# Patient Record
Sex: Male | Born: 1970
Health system: Southern US, Community
[De-identification: ages and names within clinical notes are randomized; demographics above are authoritative.]

## PROBLEM LIST (undated history)

## (undated) DIAGNOSIS — E785 Hyperlipidemia, unspecified: Secondary | ICD-10-CM

## (undated) DIAGNOSIS — I10 Essential (primary) hypertension: Secondary | ICD-10-CM

## (undated) DIAGNOSIS — Q245 Malformation of coronary vessels: Secondary | ICD-10-CM

## (undated) DIAGNOSIS — R9439 Abnormal result of other cardiovascular function study: Secondary | ICD-10-CM

## (undated) DIAGNOSIS — E669 Obesity, unspecified: Secondary | ICD-10-CM

## (undated) HISTORY — DX: Abnormal result of other cardiovascular function study: R94.39

## (undated) HISTORY — DX: Hyperlipidemia, unspecified: E78.5

## (undated) HISTORY — DX: Essential (primary) hypertension: I10

## (undated) HISTORY — DX: Obesity, unspecified: E66.9

## (undated) HISTORY — DX: Malformation of coronary vessels: Q24.5

---

## 2015-06-15 ENCOUNTER — Other Ambulatory Visit (HOSPITAL_COMMUNITY): Payer: Self-pay | Admitting: Physician Assistant

## 2015-06-15 ENCOUNTER — Ambulatory Visit (HOSPITAL_COMMUNITY)
Admission: RE | Admit: 2015-06-15 | Discharge: 2015-06-15 | Disposition: A | Discharge status: Home / Self Care | Payer: BLUE CROSS/BLUE SHIELD | Source: Ambulatory Visit | Attending: Physician Assistant | Admitting: Physician Assistant

## 2015-06-15 DIAGNOSIS — R079 Chest pain, unspecified: Secondary | ICD-10-CM | POA: Diagnosis not present

## 2015-06-15 DIAGNOSIS — R0789 Other chest pain: Secondary | ICD-10-CM

## 2015-07-21 ENCOUNTER — Encounter: Payer: Self-pay | Admitting: *Deleted

## 2015-08-03 ENCOUNTER — Encounter: Payer: Self-pay | Admitting: *Deleted

## 2015-08-04 ENCOUNTER — Encounter: Payer: Self-pay | Admitting: Cardiology

## 2015-08-04 ENCOUNTER — Encounter: Payer: Self-pay | Admitting: *Deleted

## 2015-08-04 ENCOUNTER — Ambulatory Visit (INDEPENDENT_AMBULATORY_CARE_PROVIDER_SITE_OTHER): Payer: BLUE CROSS/BLUE SHIELD | Admitting: Cardiology

## 2015-08-04 VITALS — BP 148/98 | HR 77 | Ht 66.0 in | Wt 214.0 lb

## 2015-08-04 DIAGNOSIS — I10 Essential (primary) hypertension: Secondary | ICD-10-CM | POA: Diagnosis not present

## 2015-08-04 DIAGNOSIS — R072 Precordial pain: Secondary | ICD-10-CM | POA: Diagnosis not present

## 2015-08-04 DIAGNOSIS — E785 Hyperlipidemia, unspecified: Secondary | ICD-10-CM

## 2015-08-04 DIAGNOSIS — R9431 Abnormal electrocardiogram [ECG] [EKG]: Secondary | ICD-10-CM

## 2015-08-04 NOTE — Patient Instructions (Signed)
Your physician has requested that you have a stress echocardiogram. For further information please visit [email or web address]. Please follow instruction sheet as given. Office will contact with results via phone or letter.   Continue all current medications. Follow up based on test results.

## 2015-08-04 NOTE — Progress Notes (Signed)
Cardiology Office Note  Date: 08/04/2015   ID: Duane Crosby, DOB [birth year], MRN [id number]  PCP: Lenise HeraldMANN, BENJAMIN, PA-C  Primary Cardiologist: Nona DellSamuel Reneka Nebergall, MD   Chief Complaint  Patient presents with  . Dizziness  . History of chest pain    History of Present Illness: Duane Crosby is a 44 y.o. male referred by Mr. Marcelline MatesMann PA-C for cardiology evaluation. Records indicate evaluation back in October for dizziness and chest pain. He is here with his wife today. He states that over the last 8 months he has been experiencing a "sharp" discomfort in his left chest, occurs somewhat sporadically, sometimes when he is at work. Symptoms last generally for a few minutes and are of moderate intensity. There are no specific alleviating factors. He works as a Chartered certified accountantmachinist, no particularly heavy work at this time. He has not noticed any specific change in pattern, symptoms occur at least once every few weeks.  He reports having undergone a stress test nearly 20 years ago, does not recall any abnormalities. He has a long-standing history of hypertension, and reports compliance with his medications which we reviewed below.  He does not report any specific palpitations or syncope with his symptoms, sometimes a mild feeling of dizziness.  Past Medical History  Diagnosis Date  . Hyperlipidemia   . Essential hypertension     History reviewed. No pertinent past surgical history.  Current Outpatient Prescriptions  Medication Sig Dispense Refill  . atorvastatin (LIPITOR) 20 MG tablet Take 20 mg by mouth daily.  1  . diltiazem (CARDIZEM CD) 180 MG 24 hr capsule Take 180 mg by mouth daily.    Marland Kitchen. lisinopril (PRINIVIL,ZESTRIL) 40 MG tablet Take 40 mg by mouth daily.     No current facility-administered medications for this visit.   Allergies:  Review of patient's allergies indicates no known allergies.   Social History: The patient  reports that he has never smoked. He has never used smokeless  tobacco. He reports that he drinks alcohol. He reports that he does not use illicit drugs.   Family History: The patient's family history includes Diabetes Mellitus II in his father; Hypertension in his brother and father.   ROS:  Please see the history of present illness. Otherwise, complete review of systems is positive for none.  All other systems are reviewed and negative.   Physical Exam: VS:  BP 148/98 mmHg  Pulse 77  Ht 5\' 6"  (1.676 m)  Wt 214 lb (97.07 kg)  BMI 34.56 kg/m2  SpO2 99%, BMI Body mass index is 34.56 kg/(m^2).  Wt Readings from Last 3 Encounters:  08/04/15 214 lb (97.07 kg)  06/01/15 215 lb (97.523 kg)    General: Patient appears comfortable at rest. HEENT: Conjunctiva and lids normal, oropharynx clear. Neck: Supple, no elevated JVP or carotid bruits, no thyromegaly. Lungs: Clear to auscultation, nonlabored breathing at rest. Cardiac: Regular rate and rhythm, S4, no significant systolic murmur, no pericardial rub. Abdomen: Soft, nontender, bowel sounds present, no guarding or rebound. Extremities: No pitting edema, distal pulses 2+. Skin: Warm and dry. Musculoskeletal: No kyphosis. Neuropsychiatric: Alert and oriented x3, affect grossly appropriate.  ECG: Tracing from 06/01/2015 showed normal sinus rhythm with counterclockwise rotation, rule out old inferoposterior infarct pattern. No old tracing available for comparison  Recent Labwork:  October 2016: WBC 4.9, hemoglobin 15.6, platelets 188, cholesterol 223, triglycerides 140, HDL 41, LDL 154, BUN 11, creatinine 0.7, potassium 4.8  Assessment and Plan:  1. Intermittent precordial pain with atypical  features. He has a long-standing history of hypertension, also hyperlipidemia. ECG reviewed and does not exclude old inferoposterior infarct, although there is no old tracing for comparison. Plan is to evaluate further with an exercise echocardiogram, hold Cardizem CD on the day of the test.  2. Essential  hypertension, blood pressure elevated today. He reports compliance with Cardizem CD and lisinopril. Keep follow-up with primary care provider.  3. Hyperlipidemia, on statin therapy. LDL 154 back in October.  Current medicines were reviewed with the patient today.  Disposition: Call with results.   Signed, Jonelle Sidle, MD, [facility] 08/04/2015 10:18 AM    [facility] at [facility] [address] [place], [place], [place] [address] Phone: [phone]; Fax: [phone]

## 2015-08-10 ENCOUNTER — Other Ambulatory Visit (HOSPITAL_COMMUNITY): Payer: BLUE CROSS/BLUE SHIELD

## 2015-08-15 ENCOUNTER — Ambulatory Visit (HOSPITAL_COMMUNITY)
Admission: RE | Admit: 2015-08-15 | Discharge: 2015-08-15 | Disposition: A | Discharge status: Home / Self Care | Payer: BLUE CROSS/BLUE SHIELD | Source: Ambulatory Visit | Attending: Cardiology | Admitting: Cardiology

## 2015-08-15 ENCOUNTER — Encounter (HOSPITAL_COMMUNITY): Payer: Self-pay

## 2015-08-15 DIAGNOSIS — I252 Old myocardial infarction: Secondary | ICD-10-CM | POA: Insufficient documentation

## 2015-08-15 DIAGNOSIS — R072 Precordial pain: Secondary | ICD-10-CM | POA: Insufficient documentation

## 2015-08-15 DIAGNOSIS — R9431 Abnormal electrocardiogram [ECG] [EKG]: Secondary | ICD-10-CM | POA: Insufficient documentation

## 2015-08-15 LAB — ECHOCARDIOGRAM STRESS TEST
CHL CUP MPHR: 174 {beats}/min
CHL CUP RESTING HR STRESS: 76 {beats}/min
CHL RATE OF PERCEIVED EXERTION: 15
CSEPPHR: 160 {beats}/min
Estimated workload: 13.4 METS
Exercise duration (min): 11 min
Exercise duration (sec): 30 s
Percent HR: 91 %

## 2015-08-17 ENCOUNTER — Telehealth: Payer: Self-pay | Admitting: *Deleted

## 2015-08-17 NOTE — Telephone Encounter (Signed)
Patient sleeps all day will call you back between 4:00 & 4:30

## 2015-08-17 NOTE — Telephone Encounter (Signed)
-----   Message from Jonelle SidleSamuel G McDowell, MD sent at 08/16/2015  7:32 AM EST ----- Reviewed report. Please let the patient know that the stress test looked good, low risk in terms of both ECG and echocardiographic images. Does not suggest obstructive CAD.

## 2015-08-18 NOTE — Telephone Encounter (Signed)
Patient informed. 

## 2017-03-27 ENCOUNTER — Other Ambulatory Visit (HOSPITAL_COMMUNITY): Payer: Self-pay | Admitting: Family Medicine

## 2017-03-27 DIAGNOSIS — G64 Other disorders of peripheral nervous system: Secondary | ICD-10-CM

## 2017-04-04 ENCOUNTER — Ambulatory Visit (HOSPITAL_COMMUNITY)
Admission: RE | Admit: 2017-04-04 | Discharge: 2017-04-04 | Disposition: A | Discharge status: Home / Self Care | Payer: BLUE CROSS/BLUE SHIELD | Source: Ambulatory Visit | Attending: Family Medicine | Admitting: Family Medicine

## 2017-04-04 DIAGNOSIS — G64 Other disorders of peripheral nervous system: Secondary | ICD-10-CM

## 2017-04-04 DIAGNOSIS — R200 Anesthesia of skin: Secondary | ICD-10-CM | POA: Diagnosis not present

## 2017-04-04 DIAGNOSIS — I6523 Occlusion and stenosis of bilateral carotid arteries: Secondary | ICD-10-CM | POA: Insufficient documentation

## 2017-10-09 ENCOUNTER — Other Ambulatory Visit (HOSPITAL_COMMUNITY): Payer: Self-pay | Admitting: Physician Assistant

## 2017-10-09 ENCOUNTER — Ambulatory Visit (HOSPITAL_COMMUNITY)
Admission: RE | Admit: 2017-10-09 | Discharge: 2017-10-09 | Disposition: A | Discharge status: Home / Self Care | Payer: BLUE CROSS/BLUE SHIELD | Source: Ambulatory Visit | Attending: Physician Assistant | Admitting: Physician Assistant

## 2017-10-09 DIAGNOSIS — R0789 Other chest pain: Secondary | ICD-10-CM | POA: Insufficient documentation

## 2017-10-09 DIAGNOSIS — R7309 Other abnormal glucose: Secondary | ICD-10-CM | POA: Diagnosis not present

## 2017-10-09 DIAGNOSIS — Z1389 Encounter for screening for other disorder: Secondary | ICD-10-CM | POA: Diagnosis not present

## 2017-10-09 DIAGNOSIS — I208 Other forms of angina pectoris: Secondary | ICD-10-CM | POA: Diagnosis not present

## 2017-10-09 DIAGNOSIS — E6609 Other obesity due to excess calories: Secondary | ICD-10-CM | POA: Diagnosis not present

## 2017-10-09 DIAGNOSIS — R079 Chest pain, unspecified: Secondary | ICD-10-CM | POA: Diagnosis not present

## 2017-10-09 DIAGNOSIS — R918 Other nonspecific abnormal finding of lung field: Secondary | ICD-10-CM | POA: Diagnosis not present

## 2017-10-09 DIAGNOSIS — Z6834 Body mass index (BMI) 34.0-34.9, adult: Secondary | ICD-10-CM | POA: Diagnosis not present

## 2017-10-09 DIAGNOSIS — R0602 Shortness of breath: Secondary | ICD-10-CM | POA: Diagnosis not present

## 2017-11-11 ENCOUNTER — Encounter: Payer: Self-pay | Admitting: Cardiology

## 2017-12-11 ENCOUNTER — Ambulatory Visit: Payer: BLUE CROSS/BLUE SHIELD | Admitting: Cardiology

## 2017-12-18 DIAGNOSIS — E6609 Other obesity due to excess calories: Secondary | ICD-10-CM | POA: Diagnosis not present

## 2017-12-18 DIAGNOSIS — R300 Dysuria: Secondary | ICD-10-CM | POA: Diagnosis not present

## 2017-12-18 DIAGNOSIS — Z0001 Encounter for general adult medical examination with abnormal findings: Secondary | ICD-10-CM | POA: Diagnosis not present

## 2017-12-18 DIAGNOSIS — Z0000 Encounter for general adult medical examination without abnormal findings: Secondary | ICD-10-CM | POA: Diagnosis not present

## 2017-12-18 DIAGNOSIS — Z1389 Encounter for screening for other disorder: Secondary | ICD-10-CM | POA: Diagnosis not present

## 2017-12-18 DIAGNOSIS — Z6834 Body mass index (BMI) 34.0-34.9, adult: Secondary | ICD-10-CM | POA: Diagnosis not present

## 2018-01-29 ENCOUNTER — Ambulatory Visit: Payer: BLUE CROSS/BLUE SHIELD | Admitting: Cardiology

## 2018-01-29 ENCOUNTER — Telehealth: Payer: Self-pay | Admitting: Cardiology

## 2018-01-29 ENCOUNTER — Encounter: Payer: Self-pay | Admitting: Cardiology

## 2018-01-29 VITALS — BP 133/79 | HR 69 | Ht 66.0 in | Wt 217.8 lb

## 2018-01-29 DIAGNOSIS — R072 Precordial pain: Secondary | ICD-10-CM | POA: Diagnosis not present

## 2018-01-29 DIAGNOSIS — E782 Mixed hyperlipidemia: Secondary | ICD-10-CM | POA: Diagnosis not present

## 2018-01-29 DIAGNOSIS — Z87898 Personal history of other specified conditions: Secondary | ICD-10-CM

## 2018-01-29 DIAGNOSIS — R9431 Abnormal electrocardiogram [ECG] [EKG]: Secondary | ICD-10-CM | POA: Diagnosis not present

## 2018-01-29 DIAGNOSIS — I10 Essential (primary) hypertension: Secondary | ICD-10-CM

## 2018-01-29 NOTE — Progress Notes (Signed)
Cardiology Office Note  Date: 01/29/2018   ID: Duane Crosby, DOB [birth year], MRN [id number]  PCP: Samuella BruinMann, Benjamin L, PA-C  Primary Cardiologist: Nona DellSamuel Paislynn Hegstrom, MD   Chief Complaint  Patient presents with  . Chest Pain    History of Present Illness: Duane Crosby is a 47 y.o. male not seen since December 2016.  He has a history of intermittent chest pain with previous reassuring ischemic testing.  He is referred back to the office by his PCP for evaluation of chest discomfort.  He describes a dull ache in his chest that seems to occur mostly when he is upset or worried about things.  This does not occur specifically with exertion.  He states that he takes his medications fairly consistently although occasionally misses doses.  He does not report any palpitations, lightheadedness, or syncope.  Exercise echocardiogram from January 2017 indicated no ischemic ST segment changes or wall motion abnormalities to suggest obstructive CAD, normal LVEF.  I personally reviewed his ECG today which shows sinus rhythm with IVCD and possible old inferior infarct pattern.  Past Medical History:  Diagnosis Date  . Essential hypertension   . Hyperlipidemia     History reviewed. No pertinent surgical history.  Current Outpatient Medications  Medication Sig Dispense Refill  . atorvastatin (LIPITOR) 40 MG tablet Take 40 mg by mouth daily.    Marland Kitchen. diltiazem (CARDIZEM CD) 240 MG 24 hr capsule Take 240 mg by mouth daily.    Marland Kitchen. lisinopril (PRINIVIL,ZESTRIL) 40 MG tablet Take 40 mg by mouth daily.     No current facility-administered medications for this visit.    Allergies:  Patient has no known allergies.   Social History: The patient  reports that he has been smoking.  He has never used smokeless tobacco. He reports that he drinks alcohol. He reports that he does not use drugs.   Family History: The patient's family history includes Diabetes Mellitus II in his father; Hypertension in his  brother and father.   ROS:  Please see the history of present illness. Otherwise, complete review of systems is positive for occasional tingling in his left leg and left arm.  All other systems are reviewed and negative.   Physical Exam: VS:  BP 133/79   Pulse 69   Ht 5\' 6"  (1.676 m)   Wt 217 lb 12.8 oz (98.8 kg)   SpO2 96%   BMI 35.15 kg/m , BMI Body mass index is 35.15 kg/m.  Wt Readings from Last 3 Encounters:  01/29/18 217 lb 12.8 oz (98.8 kg)  08/04/15 214 lb (97.1 kg)  06/01/15 215 lb (97.5 kg)    General: Overweight male, appears comfortable at rest. HEENT: Conjunctiva and lids normal, oropharynx clear. Neck: Supple, no elevated JVP or carotid bruits, no thyromegaly. Lungs: Clear to auscultation, nonlabored breathing at rest. Cardiac: Regular rate and rhythm, no S3 or significant systolic murmur, no pericardial rub. Abdomen: Soft, nontender, bowel sounds present. Extremities: No pitting edema, distal pulses 2+. Skin: Warm and dry. Musculoskeletal: No kyphosis. Neuropsychiatric: Alert and oriented x3, affect grossly appropriate.  ECG: I personally reviewed the tracing from 06/01/2015 showed normal sinus rhythm with counterclockwise rotation, rule out old inferoposterior infarct pattern. No old tracing available for comparison  Recent Labwork:  October 2016: WBC 4.9, hemoglobin 15.6, platelets 188, cholesterol 223, triglycerides 140, HDL 41, LDL 154, BUN 11, creatinine 0.7, potassium 4.8  Other Studies Reviewed Today:  Exercise echocardiogram 08/15/2015: Study Conclusions  - Baseline ECG: Normal sinus  rhythm. There was an old inferior   myocardial infarction. - Stress ECG conclusions: No ischemic ST-T abnormalities, nor any   arrhythmias. Duke scoring: exercise time of 11.5 min; maximum ST   deviation of 0 mm; no angina; resulting score is 12. This score   predicts a low risk of cardiac events. - Staged echo: Resting left ventricular systolic function is   normal,  EF 60-65%. Regional wall motion was normal. With   exercise, there was normal augmentation of all wall segments with   hyperdynamic contraction seen, LVEF 70-75%. No evidence of   inducible ischemia. Normal echo stress  Impressions:  - Normal study after maximal exercise.  Assessment and Plan:  1.  Atypical chest pain.  ECG is abnormal and suggestive of old inferior infarct pattern although he underwent reassuring ischemic testing within the last 3 years.  Cardiac risk factors include gender and hypertension as well as hyperlipidemia.  Plan is to obtain a [employer] for further evaluation.  2.  Mixed hyperlipidemia, on Lipitor.  He follows with PCP.  I do not have any recent lab work for review.  3.  Essential hypertension, on Cardizem CD and lisinopril.  Current medicines were reviewed with the patient today.   Orders Placed This Encounter  Procedures  . NM Myocar Multi W/Spect W/Wall Motion / EF  . EKG 12-Lead    Disposition: Call with test results.  Signed, Jonelle Sidle, MD, [facility] 01/29/2018 3:38 PM    [facility] at [facility] [address] [place], [place], [place] [address] Phone: [phone]; Fax: [phone]

## 2018-01-29 NOTE — Patient Instructions (Signed)
Medication Instructions:  Your physician recommends that you continue on your current medications as directed. Please refer to the Current Medication list given to you today.  Labwork: NONE  Testing/Procedures: Your physician has requested that you have a lexiscan myoview. For further information please visit [email or web address]. Please follow instruction sheet, as given.  Follow-Up: Your physician recommends that you schedule a follow-up appointment PENDING TEST RESULTS  Any Other Special Instructions Will Be Listed Below (If Applicable).  If you need a refill on your cardiac medications before your next appointment, please call your pharmacy. 

## 2018-01-29 NOTE — Telephone Encounter (Signed)
Pre-cert Verification for the following procedure   lexiscan scheduled for 02-12-2018 at [facility]

## 2018-02-12 ENCOUNTER — Encounter (HOSPITAL_BASED_OUTPATIENT_CLINIC_OR_DEPARTMENT_OTHER)
Admission: RE | Admit: 2018-02-12 | Discharge: 2018-02-12 | Disposition: A | Discharge status: Home / Self Care | Payer: BLUE CROSS/BLUE SHIELD | Source: Ambulatory Visit | Attending: Cardiology | Admitting: Cardiology

## 2018-02-12 ENCOUNTER — Encounter (HOSPITAL_COMMUNITY)
Admission: RE | Admit: 2018-02-12 | Discharge: 2018-02-12 | Disposition: A | Discharge status: Home / Self Care | Payer: BLUE CROSS/BLUE SHIELD | Source: Ambulatory Visit | Attending: Cardiology | Admitting: Cardiology

## 2018-02-12 ENCOUNTER — Encounter (HOSPITAL_COMMUNITY): Payer: Self-pay

## 2018-02-12 DIAGNOSIS — R072 Precordial pain: Secondary | ICD-10-CM | POA: Diagnosis not present

## 2018-02-12 DIAGNOSIS — R9431 Abnormal electrocardiogram [ECG] [EKG]: Secondary | ICD-10-CM | POA: Insufficient documentation

## 2018-02-12 MED ORDER — TECHNETIUM TC 99M TETROFOSMIN IV KIT
10.0000 | PACK | Freq: Once | INTRAVENOUS | Status: AC | PRN
  Administered 2018-02-12: 9.5 via INTRAVENOUS

## 2018-02-12 MED ORDER — TECHNETIUM TC 99M TETROFOSMIN IV KIT
30.0000 | PACK | Freq: Once | INTRAVENOUS | Status: AC | PRN
  Administered 2018-02-12: 30 via INTRAVENOUS

## 2018-02-12 MED ORDER — SODIUM CHLORIDE 0.9% FLUSH
INTRAVENOUS | Status: AC
  Administered 2018-02-12: 10 mL via INTRAVENOUS
  Filled 2018-02-12: qty 10

## 2018-02-12 MED ORDER — REGADENOSON 0.4 MG/5ML IV SOLN
INTRAVENOUS | Status: AC
  Administered 2018-02-12: 0.4 mg via INTRAVENOUS
  Filled 2018-02-12: qty 5

## 2018-02-13 LAB — NM MYOCAR MULTI W/SPECT W/WALL MOTION / EF
CHL CUP NUCLEAR SDS: 1
CHL CUP NUCLEAR SRS: 2
CHL CUP RESTING HR STRESS: 60 {beats}/min
LV dias vol: 121 mL (ref 62–150)
LV sys vol: 57 mL
NUC STRESS TID: 1.21
Peak HR: 78 {beats}/min
RATE: 0.34
SSS: 3

## 2018-02-16 ENCOUNTER — Telehealth: Payer: Self-pay | Admitting: *Deleted

## 2018-02-16 NOTE — Telephone Encounter (Signed)
Patient informed. Patient will be contacted tomorrow with an appointment. Copy sent to PCP.

## 2018-02-16 NOTE — Telephone Encounter (Signed)
-----   Message from Jonelle SidleSamuel G McDowell, MD sent at 02/13/2018 12:04 PM EDT ----- Results reviewed.  Test suggest possible interval infarct scar with mild to moderate peri-infarct ischemia in comparison to previous study from 3 years ago.  In light of his chest pain symptoms, even although fairly atypical in description, we should arrange a follow-up visit to discuss the possibility of a cardiac catheterization to better clarify coronary anatomy.  Please schedule an APP visit. A copy of this test should be forwarded to Shawnie DapperMann, Benjamin L, PA-C.

## 2018-02-17 DIAGNOSIS — E6609 Other obesity due to excess calories: Secondary | ICD-10-CM | POA: Diagnosis not present

## 2018-02-17 DIAGNOSIS — Z6834 Body mass index (BMI) 34.0-34.9, adult: Secondary | ICD-10-CM | POA: Diagnosis not present

## 2018-02-17 DIAGNOSIS — L089 Local infection of the skin and subcutaneous tissue, unspecified: Secondary | ICD-10-CM | POA: Diagnosis not present

## 2018-02-17 DIAGNOSIS — H6693 Otitis media, unspecified, bilateral: Secondary | ICD-10-CM | POA: Diagnosis not present

## 2018-02-17 DIAGNOSIS — Z1389 Encounter for screening for other disorder: Secondary | ICD-10-CM | POA: Diagnosis not present

## 2018-02-20 ENCOUNTER — Encounter: Payer: Self-pay | Admitting: Physician Assistant

## 2018-02-20 ENCOUNTER — Ambulatory Visit: Payer: BLUE CROSS/BLUE SHIELD | Admitting: Physician Assistant

## 2018-02-20 VITALS — BP 140/86 | HR 64 | Ht 66.0 in | Wt 220.2 lb

## 2018-02-20 DIAGNOSIS — R931 Abnormal findings on diagnostic imaging of heart and coronary circulation: Secondary | ICD-10-CM | POA: Diagnosis not present

## 2018-02-20 DIAGNOSIS — R0789 Other chest pain: Secondary | ICD-10-CM

## 2018-02-20 DIAGNOSIS — E785 Hyperlipidemia, unspecified: Secondary | ICD-10-CM | POA: Diagnosis not present

## 2018-02-20 DIAGNOSIS — R600 Localized edema: Secondary | ICD-10-CM

## 2018-02-20 DIAGNOSIS — I10 Essential (primary) hypertension: Secondary | ICD-10-CM | POA: Insufficient documentation

## 2018-02-20 DIAGNOSIS — I2510 Atherosclerotic heart disease of native coronary artery without angina pectoris: Secondary | ICD-10-CM | POA: Insufficient documentation

## 2018-02-20 MED ORDER — ASPIRIN EC 81 MG PO TBEC: 81.0000 mg | DELAYED_RELEASE_TABLET | Freq: Every day | ORAL | 3 refills | Status: DC

## 2018-02-20 NOTE — Progress Notes (Signed)
Cardiology Office Note    Date:  02/20/2018  ID:  Duane Crosby, DOB August 04, 1971, MRN [id number] PCP:  Shawnie Dapper, PA-C  Cardiologist:  Nona Dell, MD  Chief Complaint: f/u abnormal stress test  History of Present Illness:  Duane Crosby is a 47 y.o. male with history of essential HTN, HLD, obesity who presents back for f/u of stress testing. He had remote stress echo 2017 which was normal with EF 70-75%. More recently in late June 2019 he saw Dr. Diona Browner back for evaluation of chest pain with some atypical features. He underwent nuclear stress test 02/2018 showing findings consistent with prior small to moderate apical myocardial infarction, difficult to evaluate basal septal/inferior defect in setting of gut radiotracer uptake, probable prior infarct with mild to moderate peri-infarct ischemia, EF 55-65%, low to intermediate risk study. Dr. Diona Browner recommended to arrange OV to discuss possibility of proceeding with cath. No recent labs on file except October 2016: WBC 4.9, hemoglobin 15.6, platelets 188, cholesterol 223, triglycerides 140, HDL 41, LDL 154, BUN 11, creatinine 0.7, potassium 4.8.  He returns for follow-up with his son whom he requests to intermittently translate for him. He does speak fairly good [place]. He reports that he had one episode of chest pain 2 years ago, one episode 1 year ago, and one episode 2 months prior to appointment with Dr. Diona Browner, but has been completely asymptomatic since that time. He works nights and is fairly active on his job on his feet without any recent angina, dyspnea, palpitations, or syncope. He gets rare occasional LEE which is not apparent on exam today. Does not follow BP at home. PCP follows lipids.   Past Medical History:  Diagnosis Date  . Essential hypertension   . Hyperlipidemia   . Obesity     History reviewed. No pertinent surgical history.  Current Medications:  Current Outpatient Medications:  .   atorvastatin (LIPITOR) 40 MG tablet, Take 40 mg by mouth daily., Disp: , Rfl:  .  cephALEXin (KEFLEX) 500 MG capsule, Take 1 capsule by mouth QID., Disp: , Rfl: 0 .  diltiazem (CARDIZEM CD) 240 MG 24 hr capsule, Take 240 mg by mouth daily., Disp: , Rfl:  .  lisinopril (PRINIVIL,ZESTRIL) 40 MG tablet, Take 40 mg by mouth daily., Disp: , Rfl:     Allergies:   Patient has no known allergies.   Social History   Socioeconomic History  . Marital status: Married    Spouse name: [name]  . Number of children: [name]  . Years of education: [name]  . Highest education level: [name]  Occupational History  . [name]  Social Needs  . Financial resource strain: [name]  . Food insecurity:    Worry: [name]    Inability: [name]  . Transportation needs:    Medical: [name]    Non-medical: [name]  Tobacco Use  . Smoking status: Former Games developer  . Smokeless tobacco: Never Used  . Tobacco comment: very rarely  Substance and Sexual Activity  . Alcohol use: Yes    Alcohol/week: 0.0 oz  . Drug use: No  . Sexual activity: [name]  Lifestyle  . Physical activity:    Days per week: [name]    Minutes per session: [name]  . Stress: [name]  Relationships  . Social connections:    Talks on phone: Not  on file    Gets together: [name]    Attends religious service: [name]    Active member of club or organization: [name]    Attends meetings of clubs or organizations: [name]    Relationship status: [name]  Other Topics Concern  . [name]  Social History Narrative  . [name]     Family History:  The patient's family history includes Diabetes Mellitus II in his father; Hypertension in his brother and father.  ROS:   Please see the history of present illness.  All other systems are reviewed and otherwise negative.    PHYSICAL EXAM:   VS:  BP 140/86   Pulse 64   Ht 5\' 6"  (1.676 m)   Wt 220 lb  3.2 oz (99.9 kg)   SpO2 94% Comment: on room air  BMI 35.54 kg/m   BMI: Body mass index is 35.54 kg/m. GEN: Well nourished, well developed obese Hispanic M, in no acute distress HEENT: normocephalic, atraumatic Neck: no JVD, carotid bruits, or masses Cardiac:RRR; no murmurs, rubs, or gallops, no edema  Respiratory:  clear to auscultation bilaterally, normal work of breathing GI: soft, nontender, nondistended, + BS MS: no deformity or atrophy Skin: warm and dry, no rash Neuro:  Alert and Oriented x 3, Strength and sensation are intact, follows commands Psych: euthymic mood, full affect  Wt Readings from Last 3 Encounters:  02/20/18 220 lb 3.2 oz (99.9 kg)  01/29/18 217 lb 12.8 oz (98.8 kg)  08/04/15 214 lb (97.1 kg)      Studies/Labs Reviewed:   EKG:  EKG was ordered today and personally reviewed by me and demonstrates NSR 64bpm no acute ST thcnages  Recent Labs: No results found for requested labs within last 8760 hours.   Lipid Panel No results found for: CHOL, TRIG, HDL, CHOLHDL, VLDL, LDLCALC, LDLDIRECT  Additional studies/ records that were reviewed today include: Summarized above.   ASSESSMENT & PLAN:   1. Chest pain, atypical - extremely infrequent, patient clarifies last episode was 2 months before he recently saw MD without recent recurrence despite regular physical activity. Does not exercise but stays fairly active. Nuclear imaging appears at least partially skewed by gut uptake. EKG unrevealing. I discussed with Dr. Wyline Mood (DOD) as Dr. Diona Browner is out of the office - I'm not convinced at this time that I would suggest proceeding right to cardiac catheterization given that he is asymptomatic. Will start a baby aspirin. Warning sx discussed with patient - I will forward to Dr. Diona Browner to get his input. 2. Abnormal stress test - as above. Start baby aspirin and observe. 3. Essential HTN - BP running slightly elevated in office today. He's on a little unusual of a  regimen as dilt is not usually a great blood pressure medicine. He does describe mild edema at times. The patient was instructed to monitor their blood pressure at home over the next few days and call in with readings. If suboptimal, would probably suggest we consider switching diltiazem to a diuretic. Asked triage staff in staff message to obtain most recent labs from PCP. 4. Hyperlipidemia - followed by primary care. 5. Lower extremity edema - not apparent on exam today but discussed compression stockings and lower sodium diet.  Disposition: F/u with Dr. Diona Browner in 6 months.  Medication Adjustments/Labs and Tests Ordered: Current medicines are reviewed at length with the patient today.  Concerns regarding medicines are outlined above. Medication  changes, Labs and Tests ordered today are summarized above and listed in the Patient Instructions accessible in Encounters.   Signed, Laurann MontanaDayna N Dunn, PA-C  02/20/2018 3:33 PM     [facility] -  Location in [facility] [address] [place], [place] [address] Ph: [phone]; Fax [phone]

## 2018-02-20 NOTE — H&P (View-Only) (Signed)
 Cardiology Office Note    Date:  02/20/2018  ID:  Duane Crosby, DOB 09/16/1970, MRN [id number] PCP:  Mann, Benjamin L, PA-C  Cardiologist:  Samuel McDowell, MD  Chief Complaint: f/u abnormal stress test  History of Present Illness:  Duane Crosby is a 46 y.o. male with history of essential HTN, HLD, obesity who presents back for f/u of stress testing. He had remote stress echo 2017 which was normal with EF 70-75%. More recently in late June 2019 he saw Dr. McDowell back for evaluation of chest pain with some atypical features. He underwent nuclear stress test 02/2018 showing findings consistent with prior small to moderate apical myocardial infarction, difficult to evaluate basal septal/inferior defect in setting of gut radiotracer uptake, probable prior infarct with mild to moderate peri-infarct ischemia, EF 55-65%, low to intermediate risk study. Dr. McDowell recommended to arrange OV to discuss possibility of proceeding with cath. No recent labs on file except October 2016: WBC 4.9, hemoglobin 15.6, platelets 188, cholesterol 223, triglycerides 140, HDL 41, LDL 154, BUN 11, creatinine 0.7, potassium 4.8.  He returns for follow-up with his son whom he requests to intermittently translate for him. He does speak fairly good English. He reports that he had one episode of chest pain 2 years ago, one episode 1 year ago, and one episode 2 months prior to appointment with Dr. McDowell, but has been completely asymptomatic since that time. He works nights and is fairly active on his job on his feet without any recent angina, dyspnea, palpitations, or syncope. He gets rare occasional LEE which is not apparent on exam today. Does not follow BP at home. PCP follows lipids.   Past Medical History:  Diagnosis Date  . Essential hypertension   . Hyperlipidemia   . Obesity     History reviewed. No pertinent surgical history.  Current Medications:  Current Outpatient Medications:  .   atorvastatin (LIPITOR) 40 MG tablet, Take 40 mg by mouth daily., Disp: , Rfl:  .  cephALEXin (KEFLEX) 500 MG capsule, Take 1 capsule by mouth QID., Disp: , Rfl: 0 .  diltiazem (CARDIZEM CD) 240 MG 24 hr capsule, Take 240 mg by mouth daily., Disp: , Rfl:  .  lisinopril (PRINIVIL,ZESTRIL) 40 MG tablet, Take 40 mg by mouth daily., Disp: , Rfl:     Allergies:   Patient has no known allergies.   Social History   Socioeconomic History  . Marital status: Married    Spouse name: [name]  . Number of children: [name]  . Years of education: [name]  . Highest education level: [name]  Occupational History  . [name]  Social Needs  . Financial resource strain: [name]  . Food insecurity:    Worry: [name]    Inability: [name]  . Transportation needs:    Medical: [name]    Non-medical: [name]  Tobacco Use  . Smoking status: Former Smoker  . Smokeless tobacco: Never Used  . Tobacco comment: very rarely  Substance and Sexual Activity  . Alcohol use: Yes    Alcohol/week: 0.0 oz  . Drug use: No  . Sexual activity: [name]  Lifestyle  . Physical activity:    Days per week: [name]    Minutes per session: [name]  . Stress: [name]  Relationships  . Social connections:    Talks on phone: Not   on file    Gets together: [name]    Attends religious service: [name]    Active member of club or organization: [name]    Attends meetings of clubs or organizations: [name]    Relationship status: [name]  Other Topics Concern  . [name]  Social History Narrative  . [name]     Family History:  The patient's family history includes Diabetes Mellitus II in his father; Hypertension in his brother and father.  ROS:   Please see the history of present illness.  All other systems are reviewed and otherwise negative.    PHYSICAL EXAM:   VS:  BP 140/86   Pulse 64   Ht 5' 6" (1.676 m)   Wt 220 lb  3.2 oz (99.9 kg)   SpO2 94% Comment: on room air  BMI 35.54 kg/m   BMI: Body mass index is 35.54 kg/m. GEN: Well nourished, well developed obese Hispanic M, in no acute distress HEENT: normocephalic, atraumatic Neck: no JVD, carotid bruits, or masses Cardiac:RRR; no murmurs, rubs, or gallops, no edema  Respiratory:  clear to auscultation bilaterally, normal work of breathing GI: soft, nontender, nondistended, + BS MS: no deformity or atrophy Skin: warm and dry, no rash Neuro:  Alert and Oriented x 3, Strength and sensation are intact, follows commands Psych: euthymic mood, full affect  Wt Readings from Last 3 Encounters:  02/20/18 220 lb 3.2 oz (99.9 kg)  01/29/18 217 lb 12.8 oz (98.8 kg)  08/04/15 214 lb (97.1 kg)      Studies/Labs Reviewed:   EKG:  EKG was ordered today and personally reviewed by me and demonstrates NSR 64bpm no acute ST thcnages  Recent Labs: No results found for requested labs within last 8760 hours.   Lipid Panel No results found for: CHOL, TRIG, HDL, CHOLHDL, VLDL, LDLCALC, LDLDIRECT  Additional studies/ records that were reviewed today include: Summarized above.   ASSESSMENT & PLAN:   1. Chest pain, atypical - extremely infrequent, patient clarifies last episode was 2 months before he recently saw MD without recent recurrence despite regular physical activity. Does not exercise but stays fairly active. Nuclear imaging appears at least partially skewed by gut uptake. EKG unrevealing. I discussed with Dr. Branch (DOD) as Dr. McDowell is out of the office - I'm not convinced at this time that I would suggest proceeding right to cardiac catheterization given that he is asymptomatic. Will start a baby aspirin. Warning sx discussed with patient - I will forward to Dr. McDowell to get his input. 2. Abnormal stress test - as above. Start baby aspirin and observe. 3. Essential HTN - BP running slightly elevated in office today. He's on a little unusual of a  regimen as dilt is not usually a great blood pressure medicine. He does describe mild edema at times. The patient was instructed to monitor their blood pressure at home over the next few days and call in with readings. If suboptimal, would probably suggest we consider switching diltiazem to a diuretic. Asked triage staff in staff message to obtain most recent labs from PCP. 4. Hyperlipidemia - followed by primary care. 5. Lower extremity edema - not apparent on exam today but discussed compression stockings and lower sodium diet.  Disposition: F/u with Dr. McDowell in 6 months.  Medication Adjustments/Labs and Tests Ordered: Current medicines are reviewed at length with the patient today.  Concerns regarding medicines are outlined above. Medication   changes, Labs and Tests ordered today are summarized above and listed in the Patient Instructions accessible in Encounters.   Signed, Allon Costlow N Nautica Hotz, PA-C  02/20/2018 3:33 PM    [facility] - [place] [facility] in [facility] [address] [place], [place] [address] Ph: [phone]; Fax [phone] 

## 2018-02-20 NOTE — Patient Instructions (Signed)
Medication Instructions:  Your physician has recommended you make the following change in your medication:  Aspirin 81 mg Daily    Labwork: NONE   Testing/Procedures: NONE   Follow-Up: Your physician wants you to follow-up in: 6 Months. You will receive a reminder letter in the mail two months in advance. If you don't receive a letter, please call our office to schedule the follow-up appointment.   Any Other Special Instructions Will Be Listed Below (If Applicable).     If you need a refill on your cardiac medications before your next appointment, please call your pharmacy.  Thank you for choosing [facility]!

## 2018-02-23 ENCOUNTER — Encounter: Payer: Self-pay | Admitting: *Deleted

## 2018-02-23 ENCOUNTER — Telehealth: Payer: Self-pay | Admitting: Physician Assistant

## 2018-02-23 NOTE — Telephone Encounter (Addendum)
I relayed message that Dr.McDowell agreed to hold on any testing.He will call back the end of this week with BP readings.

## 2018-02-23 NOTE — Telephone Encounter (Addendum)
Please call patient and let him know I reviewed chart with Dr. Diona BrownerMcDowell who agrees with continuing plan for watching for any recurrent symptoms and holding off further testing. On Friday I asked the patient to check his BP daily for 1 week and relay those readings to us (just a heads up as he may share some of those values with you if he started checking - otherwise we will wait for his formal 1 week report that he is going to call us with).  Can you get me a copy of his last labs (particularly BMET, thyroid) from PCP? Thanks.   Ankita Newcomer PA-C

## 2018-03-12 ENCOUNTER — Telehealth: Payer: Self-pay | Admitting: Cardiology

## 2018-03-12 DIAGNOSIS — I2510 Atherosclerotic heart disease of native coronary artery without angina pectoris: Secondary | ICD-10-CM

## 2018-03-12 NOTE — Telephone Encounter (Signed)
Patient states Sunday night while at home resting he developed chest pain and shortness of breath. The episode lasted a couple of hours. Pain did not radiate anywhere. Patient did have some nausea. These same symptoms happened again Monday and Tuesday night at work. Patient states the episode on Monday lasted around 6 hours. Patient states today he has had some slight pressure in his chest but no actual chest pain or shortness of breath. Patient has been compliant with medications. Patient is not having any symptoms at time of phone call. Will forward to provider for recommendations. Advised patient if he develops chest pain again he needs to go to the ER. Patient verbalized understanding.

## 2018-03-12 NOTE — Telephone Encounter (Signed)
   Hi Dayna. I am covering Dr. Ival BibleMcDowell's inbox and received the below information that this patient has had recurrent chest discomfort. It overall seems somewhat atypical as his symptoms have been lasting for over 6 hours and then spontaneously resolved. It looks like you last evaluated him on 02/20/2018 following his abnormal stress test and it was recommended to continue with medical management at that time and you would reach out to Dr. Diona BrownerMcDowell to get his input. Did you talk with the patient about a cardiac catheterization at that time as he mentioned wanting to proceed with further testing?   Thanks for your input.   Signed, Ellsworth LennoxBrittany M Kimbly Eanes, PA-C 03/12/2018, 4:28 PM Pager: [phone]

## 2018-03-12 NOTE — Telephone Encounter (Signed)
PATIENT walked in  Mar 09, 2018 patient stated his symptoms returned - Chest Pain with SOB occurred while he was working  Patient was not experiencing any symptoms at the time he came in  Wanted to discuss if they should go ahead with testing

## 2018-03-13 NOTE — Telephone Encounter (Signed)
    Called patient and reviewed catheterization again. Risks and benefits again reviewed and he is in agreement to proceed. Says that any day next week should work. He will need routine pre-cardiac catheterization labs since his most recent CBC and BMET were in 12/2017. Please call the patient with date and time of the procedure.  This had previously been discussed with Dr. Diona BrownerMcDowell and plans were for a catheterization if he had recurrent symptoms. Will route encounter to his inbox to make him aware of upcoming procedure and for any further recommendations.    Signed, Ellsworth LennoxBrittany M Rhianon Zabawa, PA-C 03/13/2018, 3:53 PM Pager: [phone]

## 2018-03-13 NOTE — Telephone Encounter (Signed)
Hey GrenadaBrittany! At the time of his OV, the only episode of CP he had reported was 1 month prior to the office visit with Dr. Diona BrownerMcDowell. He was 100% asymptomatic - both Dr. Wyline MoodBranch (DOD) that day and Dr. Diona BrownerMcDowell agreed that watching for any recurrence was next best step. I did review the possibility of a cath (showed him pictures of the procedure, discussed risks/benefits) but he did not specify wanting to move forward at that time. We did discuss monitoring of his BP that was elevated but he never called back with readings. If having more symptoms, I think we should probably move forward on the cath for definitive evaluation. I am not back in the office again until Wednesday but do you think you could get him plugged in? He should have labs scanned in but nothing recent in our office so will need these. Dayna Dunn PA-C

## 2018-03-13 NOTE — Telephone Encounter (Signed)
    Attempted to call the patient to further review the cardiac catheterization which had previously been discussed with him and to answer any remaining questions he may have about the procedure. If he is in agreement with testing, we will plan to move forward with a LHC for abnormal stress test and chest pain. Left a voicemail asking him to return my call. Will reattempt to reach the patient later today.  Signed, Ellsworth LennoxBrittany M Isamar Nazir, PA-C 03/13/2018, 11:50 AM Pager: [phone]

## 2018-03-13 NOTE — Telephone Encounter (Signed)
LHC scheduled for 8/15 at 5:30 with McAlhany. Patient notified and verbalized understanding. Instruction letter and labs ready for pick up. Patient coming Monday

## 2018-03-14 ENCOUNTER — Other Ambulatory Visit: Payer: Self-pay | Admitting: Cardiology

## 2018-03-14 NOTE — Progress Notes (Signed)
I last saw Mr. Duane Crosby in June for assessment of chest pain. He was referred for a Myoview which was performed in July with results as outlined below:   There was no ST segment deviation noted during stress.  Findings consistent with prior small to moderate apical myocardial infarction. Difficult to evaluate basal septal/inferior defect in setting of gut radiotracer uptake. Probable prior infarct with mild to moderate peri-infarct ischemia,  The left ventricular ejection fraction is normal (55-65%).  Low to intermediate risk study  He was seen subsequently by Ms. Dunn PA-C at which point he was not reporting any recurrent chest pain. Stress test results were reviewed with plan to continue observation on medical therapy (ASA, Lisinopril, Cardizem CD, and Lipitor).   As per chart review, patient has experienced recurrent chest pain, recently communicated to Ms. Strader PA-C in my absence. Plan is now to proceed with diagnostic cardiac catheterization for clear definition of coronary anatomy and assessment of any potential revascularization options. Risks and benefits were reviewed with the patient by Ms. Strader PA-C and he is in agreement to proceed. Procedure is scheduled for August 15 with Dr. Clifton JamesMcAlhany.  Jonelle SidleSamuel G. Willies Laviolette, M.D., F.A.C.C.

## 2018-03-16 ENCOUNTER — Telehealth: Payer: Self-pay | Admitting: Physician Assistant

## 2018-03-16 DIAGNOSIS — I2510 Atherosclerotic heart disease of native coronary artery without angina pectoris: Secondary | ICD-10-CM | POA: Diagnosis not present

## 2018-03-16 NOTE — Telephone Encounter (Signed)
Patient advised it was in the envelope he picked up from office.

## 2018-03-16 NOTE — Telephone Encounter (Signed)
Asking for instructions and where he needs to go for Cath on Mar 19, 2018

## 2018-03-17 DIAGNOSIS — I2510 Atherosclerotic heart disease of native coronary artery without angina pectoris: Secondary | ICD-10-CM | POA: Diagnosis not present

## 2018-03-19 ENCOUNTER — Other Ambulatory Visit: Payer: Self-pay

## 2018-03-19 ENCOUNTER — Encounter (HOSPITAL_COMMUNITY)
Admission: RE | Disposition: A | Discharge status: Home / Self Care | Payer: Self-pay | Source: Ambulatory Visit | Attending: Cardiovascular Disease

## 2018-03-19 ENCOUNTER — Encounter (HOSPITAL_COMMUNITY): Payer: Self-pay | Admitting: Cardiovascular Disease

## 2018-03-19 ENCOUNTER — Ambulatory Visit (HOSPITAL_COMMUNITY)
Admission: RE | Admit: 2018-03-19 | Discharge: 2018-03-19 | Disposition: A | Discharge status: Home / Self Care | Payer: BLUE CROSS/BLUE SHIELD | Source: Ambulatory Visit | Attending: Cardiovascular Disease | Admitting: Cardiovascular Disease

## 2018-03-19 DIAGNOSIS — R0789 Other chest pain: Secondary | ICD-10-CM | POA: Diagnosis not present

## 2018-03-19 DIAGNOSIS — Z87891 Personal history of nicotine dependence: Secondary | ICD-10-CM | POA: Diagnosis not present

## 2018-03-19 DIAGNOSIS — E669 Obesity, unspecified: Secondary | ICD-10-CM | POA: Diagnosis not present

## 2018-03-19 DIAGNOSIS — R9439 Abnormal result of other cardiovascular function study: Secondary | ICD-10-CM | POA: Diagnosis not present

## 2018-03-19 DIAGNOSIS — Z6835 Body mass index (BMI) 35.0-35.9, adult: Secondary | ICD-10-CM | POA: Insufficient documentation

## 2018-03-19 DIAGNOSIS — E785 Hyperlipidemia, unspecified: Secondary | ICD-10-CM | POA: Diagnosis not present

## 2018-03-19 DIAGNOSIS — R072 Precordial pain: Secondary | ICD-10-CM

## 2018-03-19 DIAGNOSIS — I10 Essential (primary) hypertension: Secondary | ICD-10-CM | POA: Diagnosis not present

## 2018-03-19 HISTORY — PX: LEFT HEART CATH AND CORONARY ANGIOGRAPHY: CATH118249

## 2018-03-19 LAB — POCT ACTIVATED CLOTTING TIME: Activated Clotting Time: 158 seconds

## 2018-03-19 SURGERY — LEFT HEART CATH AND CORONARY ANGIOGRAPHY
Anesthesia: LOCAL

## 2018-03-19 MED ORDER — LIDOCAINE HCL (PF) 1 % IJ SOLN
INTRAMUSCULAR | Status: AC
  Filled 2018-03-19: qty 30

## 2018-03-19 MED ORDER — SODIUM CHLORIDE 0.9 % IV SOLN: 250.0000 mL | INTRAVENOUS | Status: DC | PRN

## 2018-03-19 MED ORDER — MIDAZOLAM HCL 2 MG/2ML IJ SOLN
INTRAMUSCULAR | Status: DC | PRN
  Administered 2018-03-19: 2 mg via INTRAVENOUS
  Administered 2018-03-19: 1 mg via INTRAVENOUS

## 2018-03-19 MED ORDER — HEPARIN (PORCINE) IN NACL 1000-0.9 UT/500ML-% IV SOLN
INTRAVENOUS | Status: DC | PRN
  Administered 2018-03-19: 500 mL

## 2018-03-19 MED ORDER — LIDOCAINE HCL (PF) 1 % IJ SOLN
INTRAMUSCULAR | Status: DC | PRN
  Administered 2018-03-19: 2 mL
  Administered 2018-03-19: 20 mL

## 2018-03-19 MED ORDER — SODIUM CHLORIDE 0.9 % WEIGHT BASED INFUSION
3.0000 mL/kg/h | INTRAVENOUS | Status: AC
  Administered 2018-03-19: 3 mL/kg/h via INTRAVENOUS

## 2018-03-19 MED ORDER — SODIUM CHLORIDE 0.9 % IV SOLN: INTRAVENOUS | Status: AC

## 2018-03-19 MED ORDER — ACETAMINOPHEN 325 MG PO TABS: 650.0000 mg | ORAL_TABLET | ORAL | Status: DC | PRN

## 2018-03-19 MED ORDER — VERAPAMIL HCL 2.5 MG/ML IV SOLN
INTRAVENOUS | Status: DC | PRN
  Administered 2018-03-19: 10 mL via INTRA_ARTERIAL

## 2018-03-19 MED ORDER — SODIUM CHLORIDE 0.9 % WEIGHT BASED INFUSION: 1.0000 mL/kg/h | INTRAVENOUS | Status: DC

## 2018-03-19 MED ORDER — HEPARIN (PORCINE) IN NACL 1000-0.9 UT/500ML-% IV SOLN
INTRAVENOUS | Status: AC
  Filled 2018-03-19: qty 1000

## 2018-03-19 MED ORDER — MIDAZOLAM HCL 2 MG/2ML IJ SOLN
INTRAMUSCULAR | Status: AC
  Filled 2018-03-19: qty 2

## 2018-03-19 MED ORDER — SODIUM CHLORIDE 0.9% FLUSH: 3.0000 mL | Freq: Two times a day (BID) | INTRAVENOUS | Status: DC

## 2018-03-19 MED ORDER — IOPAMIDOL (ISOVUE-370) INJECTION 76%
INTRAVENOUS | Status: DC | PRN
  Administered 2018-03-19: 225 mL via INTRAVENOUS

## 2018-03-19 MED ORDER — SODIUM CHLORIDE 0.9% FLUSH: 3.0000 mL | INTRAVENOUS | Status: DC | PRN

## 2018-03-19 MED ORDER — HEPARIN SODIUM (PORCINE) 1000 UNIT/ML IJ SOLN
INTRAMUSCULAR | Status: DC | PRN
  Administered 2018-03-19: 5000 [IU] via INTRAVENOUS

## 2018-03-19 MED ORDER — VERAPAMIL HCL 2.5 MG/ML IV SOLN
INTRAVENOUS | Status: AC
  Filled 2018-03-19: qty 2

## 2018-03-19 MED ORDER — FENTANYL CITRATE (PF) 100 MCG/2ML IJ SOLN
INTRAMUSCULAR | Status: DC | PRN
  Administered 2018-03-19: 50 ug via INTRAVENOUS

## 2018-03-19 MED ORDER — ONDANSETRON HCL 4 MG/2ML IJ SOLN: 4.0000 mg | Freq: Four times a day (QID) | INTRAMUSCULAR | Status: DC | PRN

## 2018-03-19 MED ORDER — FENTANYL CITRATE (PF) 100 MCG/2ML IJ SOLN
INTRAMUSCULAR | Status: AC
  Filled 2018-03-19: qty 2

## 2018-03-19 MED ORDER — HEPARIN SODIUM (PORCINE) 1000 UNIT/ML IJ SOLN
INTRAMUSCULAR | Status: AC
  Filled 2018-03-19: qty 1

## 2018-03-19 MED ORDER — ASPIRIN 81 MG PO CHEW
CHEWABLE_TABLET | ORAL | Status: AC
  Administered 2018-03-19: 81 mg via ORAL
  Filled 2018-03-19: qty 1

## 2018-03-19 MED ORDER — ASPIRIN 81 MG PO CHEW
81.0000 mg | CHEWABLE_TABLET | ORAL | Status: AC
  Administered 2018-03-19: 81 mg via ORAL

## 2018-03-19 SURGICAL SUPPLY — 17 items
CATH 5FR JL3.5 JR4 ANG PIG MP (CATHETERS) ×2 IMPLANT
CATH INFINITI 5 FR 3DRC (CATHETERS) ×2 IMPLANT
CATH INFINITI 5 FR AL2 (CATHETERS) ×2 IMPLANT
CATH INFINITI 5 FR AR1 MOD (CATHETERS) ×2 IMPLANT
CATH INFINITI 5 FR LCB (CATHETERS) ×2 IMPLANT
CATH INFINITI 5FR AL1 (CATHETERS) ×2 IMPLANT
DEVICE RAD COMP TR BAND LRG (VASCULAR PRODUCTS) ×2 IMPLANT
GLIDESHEATH SLEND SS 6F .021 (SHEATH) ×2 IMPLANT
GUIDEWIRE INQWIRE 1.5J.035X260 (WIRE) ×1 IMPLANT
INQWIRE 1.5J .035X260CM (WIRE) ×2
KIT HEART LEFT (KITS) ×2 IMPLANT
PACK CARDIAC CATHETERIZATION (CUSTOM PROCEDURE TRAY) ×2 IMPLANT
SHEATH PINNACLE 5F 10CM (SHEATH) ×2 IMPLANT
SYR MEDRAD MARK V 150ML (SYRINGE) ×2 IMPLANT
TRANSDUCER W/STOPCOCK (MISCELLANEOUS) ×2 IMPLANT
TUBING CIL FLEX 10 FLL-RA (TUBING) ×2 IMPLANT
WIRE EMERALD 3MM-J .035X150CM (WIRE) ×2 IMPLANT

## 2018-03-19 NOTE — Progress Notes (Addendum)
Site area: RFA Site Prior to Removal:  Level 0 Pressure Applied For: 20 min Manual:   yes Patient Status During Pull:  stable Post Pull Site:  Level 0 Post Pull Instructions Given:  yes Post Pull Pulses Present: palpable Dressing Applied:  clear Bedrest begins @ 0930 till 1330 Comments:

## 2018-03-19 NOTE — Interval H&P Note (Signed)
History and Physical Interval Note:  03/19/2018 7:27 AM  Duane MajorAlberto G Crosby  has presented today for cardiac cath with the diagnosis of abn stress test/unstable angina. The various methods of treatment have been discussed with the patient and family. After consideration of risks, benefits and other options for treatment, the patient has consented to  Procedure(s): LEFT HEART CATH AND CORONARY ANGIOGRAPHY (N/A) as a surgical intervention .  The patient's history has been reviewed, patient examined, no change in status, stable for surgery.  I have reviewed the patient's chart and labs.  Questions were answered to the patient's satisfaction.    Cath Lab Visit (complete for each Cath Lab visit)  Clinical Evaluation Leading to the Procedure:   ACS: No.  Non-ACS:    Anginal Classification: CCS III  Anti-ischemic medical therapy: Minimal Therapy (1 class of medications)  Non-Invasive Test Results: Low-risk stress test findings: cardiac mortality <1%/year  Prior CABG: No previous CABG         Verne Carrowhristopher McAlhany

## 2018-03-19 NOTE — Discharge Instructions (Signed)
Drink plenty of fluids over next 48 hours and keep right wrist elevated at heart level for 24 hours  Radial Site Care Refer to this sheet in the next few weeks. These instructions provide you with information about caring for yourself after your procedure. Your health care provider may also give you more specific instructions. Your treatment has been planned according to current medical practices, but problems sometimes occur. Call your health care provider if you have any problems or questions after your procedure. What can I expect after the procedure? After your procedure, it is typical to have the following:  Bruising at the radial site that usually fades within 1-2 weeks.  Blood collecting in the tissue (hematoma) that may be painful to the touch. It should usually decrease in size and tenderness within 1-2 weeks.  Follow these instructions at home:  Take medicines only as directed by your health care provider.  You may shower 24-48 hours after the procedure or as directed by your health care provider. Remove the bandage (dressing) and gently wash the site with plain soap and water. Pat the area dry with a clean towel. Do not rub the site, because this may cause bleeding.  Do not take baths, swim, or use a hot tub until your health care provider approves.  Check your insertion site every day for redness, swelling, or drainage.  Do not apply powder or lotion to the site.  Do not flex or bend the affected arm for 24 hours or as directed by your health care provider.  Do not push or pull heavy objects with the affected arm for 24 hours or as directed by your health care provider.  Do not lift over 10 lb (4.5 kg) for 5 days after your procedure or as directed by your health care provider.  Ask your health care provider when it is okay to: ? Return to work or school. ? Resume usual physical activities or sports. ? Resume sexual activity.  Do not drive home if you are discharged the  same day as the procedure. Have someone else drive you.  You may drive 24 hours after the procedure unless otherwise instructed by your health care provider.  Do not operate machinery or power tools for 24 hours after the procedure.  If your procedure was done as an outpatient procedure, which means that you went home the same day as your procedure, a responsible adult should be with you for the first 24 hours after you arrive home.  Keep all follow-up visits as directed by your health care provider. This is important. Contact a health care provider if:  You have a fever.  You have chills.  You have increased bleeding from the radial site. Hold pressure on the site. Get help right away if:  You have unusual pain at the radial site.  You have redness, warmth, or swelling at the radial site.  You have drainage (other than a small amount of blood on the dressing) from the radial site.  The radial site is bleeding, and the bleeding does not stop after 30 minutes of holding steady pressure on the site.  Your arm or hand becomes pale, cool, tingly, or numb. This information is not intended to replace advice given to you by your health care provider. Make sure you discuss any questions you have with your health care provider. Document Released: 08/24/2010 Document Revised: 12/28/2015 Document Reviewed: 02/07/2014 Elsevier Interactive Patient Education  2018 [employer]   Femoral Site Care Refer  to this sheet in the next few weeks. These instructions provide you with information about caring for yourself after your procedure. Your health care provider may also give you more specific instructions. Your treatment has been planned according to current medical practices, but problems sometimes occur. Call your health care provider if you have any problems or questions after your procedure. What can I expect after the procedure? After your procedure, it is typical to have the  following:  Bruising at the site that usually fades within 1-2 weeks.  Blood collecting in the tissue (hematoma) that may be painful to the touch. It should usually decrease in size and tenderness within 1-2 weeks.  Follow these instructions at home:  Take medicines only as directed by your health care provider.  You may shower 24-48 hours after the procedure or as directed by your health care provider. Remove the bandage (dressing) and gently wash the site with plain soap and water. Pat the area dry with a clean towel. Do not rub the site, because this may cause bleeding.  Do not take baths, swim, or use a hot tub until your health care provider approves.  Check your insertion site every day for redness, swelling, or drainage.  Do not apply powder or lotion to the site.  Limit use of stairs to twice a day for the first 2-3 days or as directed by your health care provider.  Do not squat for the first 2-3 days or as directed by your health care provider.  Do not lift over 10 lb (4.5 kg) for 5 days after your procedure or as directed by your health care provider.  Ask your health care provider when it is okay to: ? Return to work or school. ? Resume usual physical activities or sports. ? Resume sexual activity.  Do not drive home if you are discharged the same day as the procedure. Have someone else drive you.  You may drive 24 hours after the procedure unless otherwise instructed by your health care provider.  Do not operate machinery or power tools for 24 hours after the procedure or as directed by your health care provider.  If your procedure was done as an outpatient procedure, which means that you went home the same day as your procedure, a responsible adult should be with you for the first 24 hours after you arrive home.  Keep all follow-up visits as directed by your health care provider. This is important. Contact a health care provider if:  You have a fever.  You have  chills.  You have increased bleeding from the site. Hold pressure on the site. Get help right away if:  You have unusual pain at the site.  You have redness, warmth, or swelling at the site.  You have drainage (other than a small amount of blood on the dressing) from the site.  The site is bleeding, and the bleeding does not stop after 30 minutes of holding steady pressure on the site.  Your leg or foot becomes pale, cool, tingly, or numb. This information is not intended to replace advice given to you by your health care provider. Make sure you discuss any questions you have with your health care provider. Document Released: 03/25/2014 Document Revised: 12/28/2015 Document Reviewed: 02/08/2014 Elsevier Interactive Patient Education  2018 Elsevier Inc.   Moderate Conscious Sedation, Adult, Care After These instructions provide you with information about caring for yourself after your procedure. Your health care provider may also give you more specific  instructions. Your treatment has been planned according to current medical practices, but problems sometimes occur. Call your health care provider if you have any problems or questions after your procedure. What can I expect after the procedure? After your procedure, it is common:  To feel sleepy for several hours.  To feel clumsy and have poor balance for several hours.  To have poor judgment for several hours.  To vomit if you eat too soon.  Follow these instructions at home: For at least 24 hours after the procedure:   Do not: ? Participate in activities where you could fall or become injured. ? Drive. ? Use heavy machinery. ? Drink alcohol. ? Take sleeping pills or medicines that cause drowsiness. ? Make important decisions or sign legal documents. ? Take care of children on your own.  Rest. Eating and drinking  Follow the diet recommended by your health care provider.  If you vomit: ? Drink water, juice, or soup  when you can drink without vomiting. ? Make sure you have little or no nausea before eating solid foods. General instructions  Have a responsible adult stay with you until you are awake and alert.  Take over-the-counter and prescription medicines only as told by your health care provider.  If you smoke, do not smoke without supervision.  Keep all follow-up visits as told by your health care provider. This is important. Contact a health care provider if:  You keep feeling nauseous or you keep vomiting.  You feel light-headed.  You develop a rash.  You have a fever. Get help right away if:  You have trouble breathing. This information is not intended to replace advice given to you by your health care provider. Make sure you discuss any questions you have with your health care provider. Document Released: 05/12/2013 Document Revised: 12/25/2015 Document Reviewed: 11/11/2015 Elsevier Interactive Patient Education  [employer]2018 Elsevier Inc.

## 2018-04-02 ENCOUNTER — Encounter: Payer: Self-pay | Admitting: Physician Assistant

## 2018-04-02 NOTE — Progress Notes (Signed)
Cardiology Office Note    Date:  04/03/2018  ID:  Duane Crosby, DOB 10/18/1970, MRN [id number] PCP:  Duane Dapper, PA-C  Cardiologist:  Nona Dell, MD  Chief Complaint: f/u cath  History of Present Illness:  Duane Crosby is a 47 y.o. male with history of HTN, HLD, obesity who presents back for f/u of cardiac cath. He has an EKG that showed inferior Q waves back to 2016. He had remote stress echo 2017 which was normal with EF 70-75%, exercised 11.5 min. More recently in late June 2019 he saw Dr. Diona Browner back for evaluation of chest pain with some atypical features. He underwent nuclear stress test 02/2018 showing findings consistent with prior small to moderate apical myocardial infarction, difficult to evaluate basal septal/inferior defect in setting of gut radiotracer uptake, possible prior infarct with mild to moderate peri-infarct ischemia, EF 55-65%, low to intermediate risk study. Dr. Diona Browner recommended to arrange OV to discuss possibility of proceeding with cath. At time of f/u 02/20/18 he had not had a single episode of chest pain since 1 month before the stress test. We elected to manage him medically. His blood pressure was somewhat elevated at that visit. He was supposed to check at home and call in with readings within several days time but had not done so. In early August he developed intermittent CP/SOB so underwent outpatient left heart cath 03/19/18 showing normal LAD, normal circumflex, normal LVEDP, normal LVEF. Dr. Clifton James was unable to fully visualize the RCA despite many attempts with many different catheters from the right radial and right femoral artery approach. The RCA appeared to be a large caliber vessel that arises from a high anterior location or possibly anomalous from the left coronary cusp. It was recommended to see him back in f/u and arrange cardiac CT. Most recent labs 03/2018 Hgb 15.3, Plt 180, glucose 92, BUN 9, Cr 0.64, K 4.0; 12/2017 AST 75/ALT 98,  LDL 106, TSH 1.820.  He returns for follow-up today. 3 days after the cath he noticed approximately 6 hours of chest pressure both that Thursday and Sunday without specific precipitant or provocative measure. It was not described as pain. This is similar to before the cath. No reported associated sx. Since that time, he's since been able to walk actively at his job and mow the lawn without any angina or functional limitation. He states he ran out of lisinopril 2 weeks ago. Interestingly his BP is about the same today as it was when he was reportedly taking both lisinopril and diltiazem in July. I wonder if maybe he's been out longer. The patient speaks [place], but the visit was assisted with video translation services (Spanish) particularly with regard to instructions.    Past Medical History:  Diagnosis Date  . Abnormal stress test   . Essential hypertension   . Hyperlipidemia   . Obesity     Past Surgical History:  Procedure Laterality Date  . LEFT HEART CATH AND CORONARY ANGIOGRAPHY N/A 03/19/2018   Procedure: LEFT HEART CATH AND CORONARY ANGIOGRAPHY;  Surgeon: Kathleene Hazel, MD;  Location: [facility];  Service: Cardiovascular;  Laterality: N/A;    Current Medications: Current Meds  Medication Sig  . aspirin EC 81 MG tablet Take 1 tablet (81 mg total) by mouth daily.  Marland Kitchen atorvastatin (LIPITOR) 40 MG tablet Take 40 mg by mouth daily.  Marland Kitchen diltiazem (CARDIZEM CD) 240 MG 24 hr capsule Take 240 mg by mouth daily.  Marland Kitchen lisinopril (  PRINIVIL,ZESTRIL) 40 MG tablet Take 40 mg by mouth daily.      Allergies:   Patient has no known allergies.   Social History   Socioeconomic History  . Marital status: Married    Spouse name: [name]  . Number of children: [name]  . Years of education: [name]  . Highest education level: [name]  Occupational History  . [name]  Social Needs  . Financial resource strain: [name]  . Food insecurity:    Worry:  [name]    Inability: [name]  . Transportation needs:    Medical: [name]    Non-medical: [name]  Tobacco Use  . Smoking status: Former Games developer  . Smokeless tobacco: Never Used  . Tobacco comment: very rarely  Substance and Sexual Activity  . Alcohol use: Yes    Alcohol/week: 0.0 standard drinks  . Drug use: No  . Sexual activity: [name]  Lifestyle  . Physical activity:    Days per week: [name]    Minutes per session: [name]  . Stress: [name]  Relationships  . Social connections:    Talks on phone: [name]    Gets together: [name]    Attends religious service: [name]    Active member of club or organization: [name]    Attends meetings of clubs or organizations: [name]    Relationship status: [name]  Other Topics Concern  . [name]  Social History Narrative  . [name]     Family History:  The patient's family history includes Diabetes Mellitus II in his father; Hypertension in his brother and father.  ROS:   Please see the history of present illness.  All other systems are reviewed and otherwise negative.    PHYSICAL EXAM:   VS:  BP (!) 142/80   Pulse 80   Ht 5\' 6"  (1.676 m)   Wt 221 lb (100.2 kg)   SpO2 94%   BMI 35.67 kg/m   BMI: Body mass index is 35.67 kg/m. GEN: Well nourished, well developed Hispanic obese M, in no acute distress HEENT: normocephalic, atraumatic Neck: no JVD, carotid bruits, or masses Cardiac: RRR; no murmurs, rubs, or gallops, no edema  Respiratory:  clear to auscultation bilaterally, normal work of breathing GI: soft, nontender, nondistended, + BS MS: no deformity or atrophy Skin: warm and dry, no rash. Right radial cath site without hematoma or ecchymosis; good pulse. Neuro:  Alert and Oriented x 3, Strength and sensation are intact, follows commands Psych: euthymic mood, full affect  Wt Readings from Last 3 Encounters:  04/03/18 221 lb (100.2 kg)  03/19/18  218 lb (98.9 kg)  02/20/18 220 lb 3.2 oz (99.9 kg)      Studies/Labs Reviewed:   EKG: Personally reviewed, shows NSR 76bpm, old inferior Q waves that are stable from EKG in 2016  Recent Labs: As above  Additional studies/ records that were reviewed today include: Summarized above   ASSESSMENT & PLAN:   1. Chest pain, unspecified with unvisualized RCA - continues to have intermittent atypical episodes. LCx and LAD were normal by cath. Nuclear was a poor quality result but abnormal Plan cardiac CTA to further evaluate. Continue ASA. Will add empiric trial of Zantac 150mg  daily. He recalls possible remote history of GERD. Treat BP. Warning sx reviewed.  2. HTN - ran out of lisinopril. At last visit in July his BP was 140/86 despite reportedly taking lisinopril and diltiazem. It remains similarly elevated today. I'm surprised it's not higher if he was in fact on lisinopril at that last visit, but now out of it. Will have him resume 1/2 dose to start - 20mg  daily - and increase to full tablet 40mg  daily if his home BP remains >130 for 2-3 days after initiation. (Of note, diltiazem is an unusual choice for BP control but given that we need a HR in the 60s for the cardiac CT, I did not make any changes to this today. This is managed through primary care.) Will also update TSH given elevated BP. 3. Hyperlipidemia - followed by primary care. Of note, his LFTs were somewhat abnormal on labs this summer by primary care. Will recheck today. If found to have coronary disease would suggest escalation of statin therapy if LFTs have normalized. 4. Obesity - once cleared for exercise would encourage to increase physical activity.  Disposition: F/u with APP in 4 weeks.  Medication Adjustments/Labs and Tests Ordered: Current medicines are reviewed at length with the patient today.  Concerns regarding medicines are outlined above. Medication changes, Labs and Tests ordered today are summarized above and listed  in the Patient Instructions accessible in Encounters.   Signed, Laurann Montanaayna N Tristain Daily, PA-C  04/03/2018 1:02 PM    [facility] [place] Location in [facility] [address] [place], [place] [address] Ph: [phone](336) [phone]; Fax [phone]

## 2018-04-03 ENCOUNTER — Encounter: Payer: Self-pay | Admitting: Physician Assistant

## 2018-04-03 ENCOUNTER — Other Ambulatory Visit (HOSPITAL_COMMUNITY)
Admission: RE | Admit: 2018-04-03 | Discharge: 2018-04-03 | Disposition: A | Discharge status: Home / Self Care | Payer: BLUE CROSS/BLUE SHIELD | Source: Ambulatory Visit | Attending: Physician Assistant | Admitting: Physician Assistant

## 2018-04-03 ENCOUNTER — Ambulatory Visit: Payer: BLUE CROSS/BLUE SHIELD | Admitting: Physician Assistant

## 2018-04-03 VITALS — BP 142/80 | HR 80 | Ht 66.0 in | Wt 221.0 lb

## 2018-04-03 DIAGNOSIS — I10 Essential (primary) hypertension: Secondary | ICD-10-CM

## 2018-04-03 DIAGNOSIS — E785 Hyperlipidemia, unspecified: Secondary | ICD-10-CM

## 2018-04-03 DIAGNOSIS — R079 Chest pain, unspecified: Secondary | ICD-10-CM | POA: Diagnosis not present

## 2018-04-03 DIAGNOSIS — E669 Obesity, unspecified: Secondary | ICD-10-CM | POA: Insufficient documentation

## 2018-04-03 DIAGNOSIS — Z6835 Body mass index (BMI) 35.0-35.9, adult: Secondary | ICD-10-CM | POA: Diagnosis not present

## 2018-04-03 LAB — COMPREHENSIVE METABOLIC PANEL
ALBUMIN: 4.2 g/dL (ref 3.5–5.0)
ALK PHOS: 100 U/L (ref 38–126)
ALT: 68 U/L — ABNORMAL HIGH (ref 0–44)
ANION GAP: 6 (ref 5–15)
AST: 48 U/L — AB (ref 15–41)
BILIRUBIN TOTAL: 0.8 mg/dL (ref 0.3–1.2)
BUN: 11 mg/dL (ref 6–20)
CALCIUM: 9.1 mg/dL (ref 8.9–10.3)
CO2: 28 mmol/L (ref 22–32)
Chloride: 106 mmol/L (ref 98–111)
Creatinine, Ser: 0.67 mg/dL (ref 0.61–1.24)
GFR calc Af Amer: >60 mL/min (ref >60)
GFR calc non Af Amer: >60 mL/min (ref >60)
GLUCOSE: 107 mg/dL — AB (ref 70–99)
Potassium: 3.5 mmol/L (ref 3.5–5.1)
SODIUM: 140 mmol/L (ref 135–145)
Total Protein: 8.2 g/dL — ABNORMAL HIGH (ref 6.5–8.1)

## 2018-04-03 LAB — TSH: TSH: 2.312 u[IU]/mL (ref 0.350–4.500)

## 2018-04-03 MED ORDER — RANITIDINE HCL 150 MG PO TABS: 150.0000 mg | ORAL_TABLET | Freq: Every day | ORAL | 3 refills | Status: DC

## 2018-04-03 MED ORDER — RANITIDINE HCL 150 MG PO TABS: 150.0000 mg | ORAL_TABLET | Freq: Two times a day (BID) | ORAL | 3 refills | Status: DC

## 2018-04-03 MED ORDER — LISINOPRIL 40 MG PO TABS: 40.0000 mg | ORAL_TABLET | Freq: Every day | ORAL | 3 refills | Status: DC

## 2018-04-03 MED ORDER — METOPROLOL TARTRATE 50 MG PO TABS: 50.0000 mg | ORAL_TABLET | Freq: Once | ORAL | 0 refills | Status: DC

## 2018-04-03 NOTE — Patient Instructions (Addendum)
Medication Instructions:  Your physician has recommended you make the following change in your medication:  Restart Lisinopril at 1/2 of a 40 mg tablet daily. Check BP daily and increase to full tablet if top number is greater than 130 after 2-3 days.   Zantac 150 mg Daily   Labwork: Your physician recommends that you return for lab work today. ( CMET, TSH)    Testing/Procedures:   Follow-Up: Your physician recommends that you schedule a follow-up appointment in: 4 Weeks   Any Other Special Instructions Will Be Listed Below (If Applicable).     If you need a refill on your cardiac medications before your next appointment, please call your pharmacy.  Thank you for choosing [facility]!   Please arrive at the [facility] main entrance of [facility] at xx:xx AM (30-45 minutes prior to test start time)  [facility] [address] [place], [place] [address] [phone]) [phone]  Proceed to the [facility] Radiology Department (First Floor).  Please follow these instructions carefully (unless otherwise directed):  Hold all erectile dysfunction medications at least 48 hours prior to test.  On the Night Before the Test: . Drink plenty of water. . Do not consume any caffeinated/decaffeinated beverages or chocolate 12 hours prior to your test. . Do not take any antihistamines 12 hours prior to your test. . If you take Metformin do not take 24 hours prior to test. . If the patient has contrast allergy: ? Patient will need a prescription for Prednisone and very clear instructions (as follows): 1. Prednisone 50 mg - take 13 hours prior to test 2. Take another Prednisone 50 mg 7 hours prior to test 3. Take another Prednisone 50 mg 1 hour prior to test 4. Take Benadryl 50 mg 1 hour prior to test . Patient must complete all four doses of above prophylactic medications. . Patient will need a ride after test due to Benadryl.  On the Day of the Test: . Drink  plenty of water. Do not drink any water within one hour of the test. . Do not eat any food 4 hours prior to the test. . You may take your regular medications prior to the test. . IF NOT ON A BETA BLOCKER - Take 50 mg of lopressor (metoprolol) one hour before the test. . HOLD Furosemide morning of the test.  After the Test: . Drink plenty of water. . After receiving IV contrast, you may experience a mild flushed feeling. This is normal. . On occasion, you may experience a mild rash up to 24 hours after the test. This is not dangerous. If this occurs, you can take Benadryl 25 mg and increase your fluid intake. . If you experience trouble breathing, this can be serious. If it is severe call 911 IMMEDIATELY. If it is mild, please call our office. . If you take any of these medications: Glipizide/Metformin, Avandament, Glucavance, please do not take 48 hours after completing test.

## 2018-04-07 ENCOUNTER — Telehealth: Payer: Self-pay | Admitting: *Deleted

## 2018-04-07 NOTE — Telephone Encounter (Signed)
-----   Message from Laurann Montana, PA-C sent at 04/06/2018 11:44 AM EDT ----- Please let patient know labs stable except glucose mildly elevated and LFTs slightly elevated as well. This has been decmonstrated on PCP labs as well -> recommend he f/u PCP to see if any other w/u necessary. Continue plan as discussed.

## 2018-04-07 NOTE — Telephone Encounter (Signed)
Called patient with test results. No answer. Left message to call back.  

## 2018-04-09 DIAGNOSIS — Z6834 Body mass index (BMI) 34.0-34.9, adult: Secondary | ICD-10-CM | POA: Diagnosis not present

## 2018-04-09 DIAGNOSIS — Z1389 Encounter for screening for other disorder: Secondary | ICD-10-CM | POA: Diagnosis not present

## 2018-04-09 DIAGNOSIS — R945 Abnormal results of liver function studies: Secondary | ICD-10-CM | POA: Diagnosis not present

## 2018-04-09 DIAGNOSIS — R7309 Other abnormal glucose: Secondary | ICD-10-CM | POA: Diagnosis not present

## 2018-04-09 DIAGNOSIS — Z23 Encounter for immunization: Secondary | ICD-10-CM | POA: Diagnosis not present

## 2018-04-09 DIAGNOSIS — E6609 Other obesity due to excess calories: Secondary | ICD-10-CM | POA: Diagnosis not present

## 2018-04-09 DIAGNOSIS — K760 Fatty (change of) liver, not elsewhere classified: Secondary | ICD-10-CM | POA: Diagnosis not present

## 2018-05-07 ENCOUNTER — Ambulatory Visit: Payer: BLUE CROSS/BLUE SHIELD | Admitting: Student

## 2018-05-16 DIAGNOSIS — I10 Essential (primary) hypertension: Secondary | ICD-10-CM | POA: Diagnosis not present

## 2018-05-16 DIAGNOSIS — R079 Chest pain, unspecified: Secondary | ICD-10-CM | POA: Diagnosis not present

## 2018-05-16 DIAGNOSIS — R072 Precordial pain: Secondary | ICD-10-CM | POA: Diagnosis not present

## 2018-05-16 DIAGNOSIS — Z79899 Other long term (current) drug therapy: Secondary | ICD-10-CM | POA: Diagnosis not present

## 2018-05-26 ENCOUNTER — Ambulatory Visit (HOSPITAL_COMMUNITY)
Admission: RE | Admit: 2018-05-26 | Discharge: 2018-05-26 | Disposition: A | Discharge status: Home / Self Care | Payer: BLUE CROSS/BLUE SHIELD | Source: Ambulatory Visit | Attending: Physician Assistant | Admitting: Physician Assistant

## 2018-05-26 DIAGNOSIS — R079 Chest pain, unspecified: Secondary | ICD-10-CM | POA: Diagnosis not present

## 2018-05-26 DIAGNOSIS — Q245 Malformation of coronary vessels: Secondary | ICD-10-CM | POA: Diagnosis not present

## 2018-05-26 IMAGING — CT CT HEART MORP W/ CTA COR W/ SCORE W/ CA W/CM &/OR W/O CM
4 of 7 series · 8 of 20 positions shown, 9 images · non-contrast
Comparison: None.

CLINICAL DATA: Chest pain

EXAM:
Cardiac CTA
MEDICATIONS:
Sub lingual nitro. 4mg x 2
TECHNIQUE: The patient was scanned on a Siemens [REDACTED]ice scanner. Gantry
rotation speed was 250 msecs. Collimation was 0.6 mm. A 100 kV
prospective scan was triggered in the ascending thoracic aorta at
35-75% of the R-R interval. Average HR during the scan was bpm. The
3D data set was interpreted on a dedicated work station using MPR,
MIP and VRT modes. A total of 80cc of contrast was used.

[Series 6: best diast 74 % · axial · 0.44mm/px · z∈[+1208,+1255]mm · 2 of 353 slices shown, 3 images]
[im 118/353  vessel]
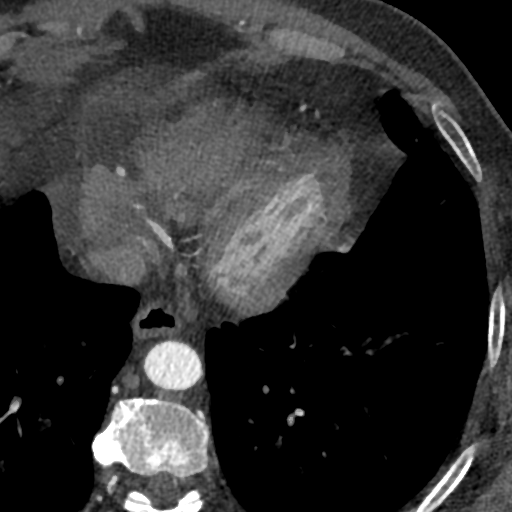
[im 118/353  lung]
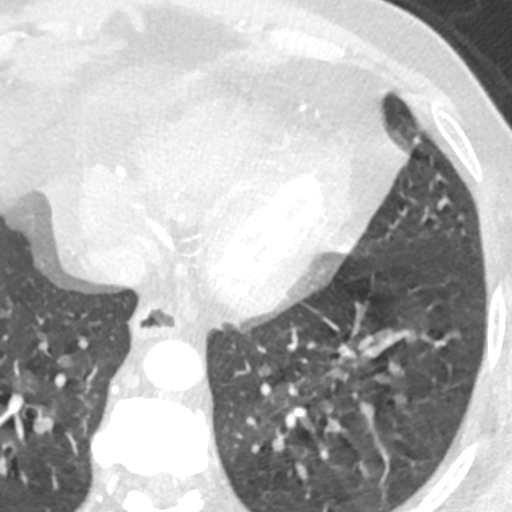
[im 235/353  vessel]
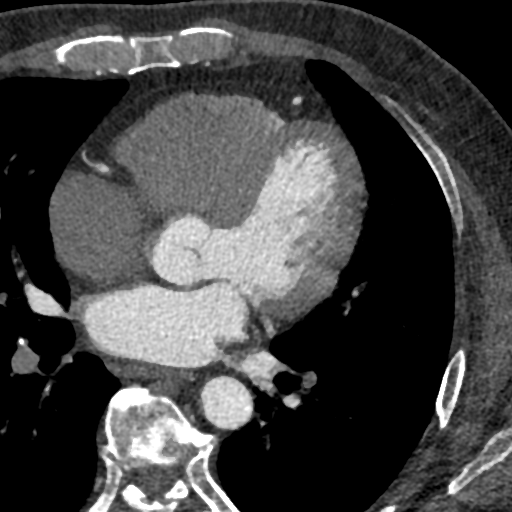

[Series 7: best syst 35 % · axial · 0.44mm/px · z∈[+1208,+1255]mm · 2 of 353 slices shown]
[im 118/353  vessel]
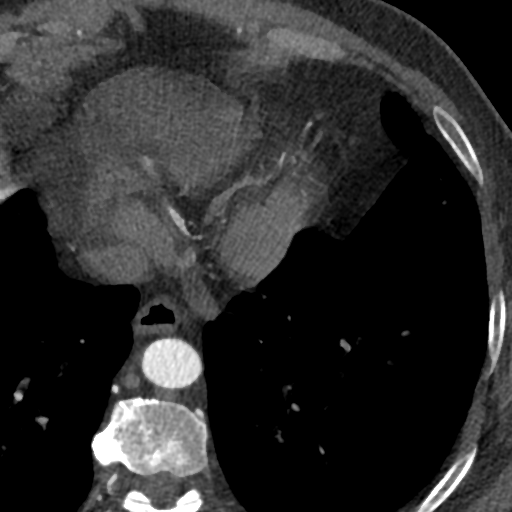
[im 235/353  vessel]
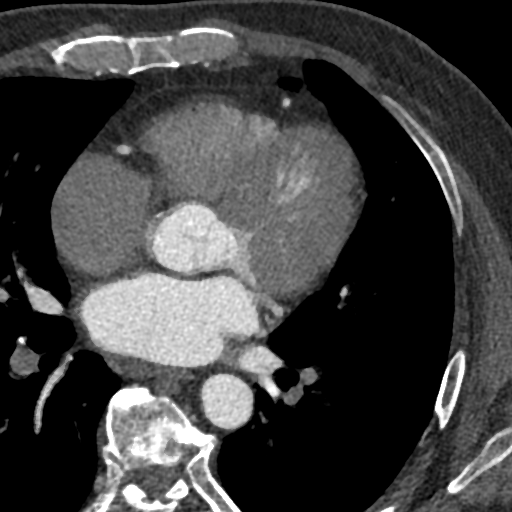

[Series 8: ts diast sharp 74 % · axial · 0.44mm/px · z∈[+1208,+1255]mm · 2 of 353 slices shown]
[im 118/353  lung]
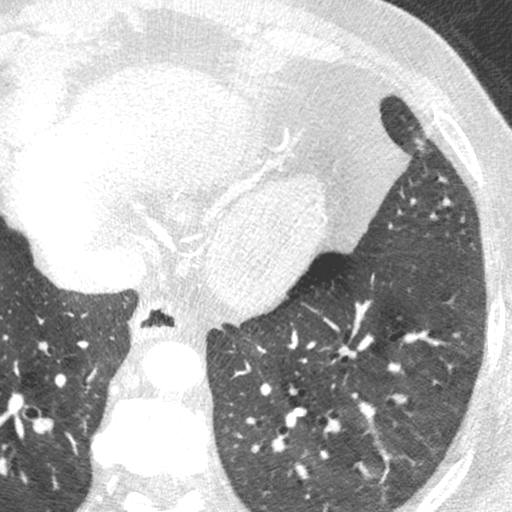
[im 235/353  lung]
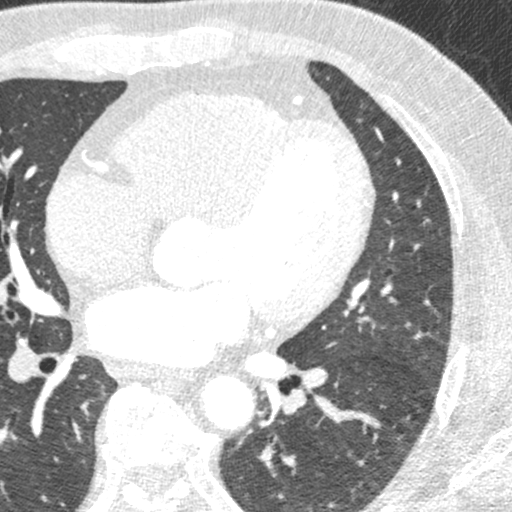

[Series 9: ts syst sharp 35 % · axial · 0.44mm/px · z∈[+1208,+1255]mm · 2 of 353 slices shown]
[im 118/353  lung]
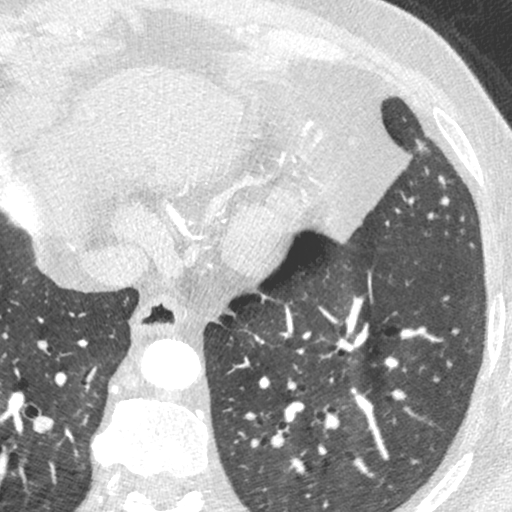
[im 235/353  lung]
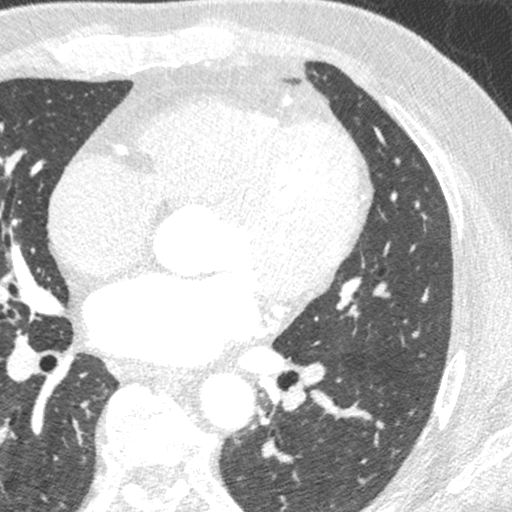

[8 of 20 positions shown; findings below may reference images not displayed]

FINDINGS: Non-cardiac: See separate report from [REDACTED].

Pulmonary veins drained normally to the left atrium.

Calcium Score: 7 Agatston units.

Coronary Arteries: The right coronary orginated from the left cusp.

LM: No plaque or stenosis.

LAD system: Mixed plaque proximal LAD, no stenosis.

Circumflex system: No plaque or stenosis. Misregistration artifact
noted.

RCA system: The RCA arises from the left cusp and courses between
the ascending aorta and the RV outflow tract (below the pulmonary
valve). The proximal RCA does appear compressed/smaller between
ascending aorta and RVOT compared to the remainder of the vessel.
There was mixed plaque with mild stenosis in the proximal RCA.
IMPRESSION: 1. Coronary artery calcium score 7 Agatston units. This places the
patient in the 76th percentile for age and gender, suggesting
intermediate risk of future cardiac events.

2.  No obstructive CAD.

3. Anomalous RCA originating from the left cusp and coursing between
the ascending aorta and RVOT. The proximal RCA does appear
compressed by its course between ascending aorta and RVOT.

Abimelk Tiger

EXAM:
OVER-READ INTERPRETATION  CT CHEST

The following report is an over-read performed by radiologist Dr.
Halili Saile [REDACTED] on 05/26/2018. This
over-read does not include interpretation of cardiac or coronary
anatomy or pathology. The coronary CTA interpretation by the
cardiologist is attached.
FINDINGS: Vascular: Heart is upper limits normal in size. Visualized aorta is
normal caliber.

Mediastinum/Nodes: No adenopathy in the lower mediastinum or hila.

Lungs/Pleura: Visualized lungs clear.  No effusions.

Upper Abdomen: No acute findings

Musculoskeletal: Chest wall soft tissues are unremarkable. No acute
bony abnormality.
IMPRESSION: No acute or significant extracardiac abnormality.

## 2018-05-26 MED ORDER — NITROGLYCERIN 0.4 MG SL SUBL
SUBLINGUAL_TABLET | SUBLINGUAL | Status: AC
  Filled 2018-05-26: qty 2

## 2018-05-26 MED ORDER — NITROGLYCERIN 0.4 MG SL SUBL
0.8000 mg | SUBLINGUAL_TABLET | Freq: Once | SUBLINGUAL | Status: AC
  Administered 2018-05-26: 0.8 mg via SUBLINGUAL

## 2018-05-26 MED ORDER — METOPROLOL TARTRATE 5 MG/5ML IV SOLN: 5.0000 mg | INTRAVENOUS | Status: DC | PRN

## 2018-05-26 MED ORDER — IOPAMIDOL (ISOVUE-370) INJECTION 76%
100.0000 mL | Freq: Once | INTRAVENOUS | Status: AC | PRN
  Administered 2018-05-26: 100 mL via INTRAVENOUS

## 2018-05-28 DIAGNOSIS — R079 Chest pain, unspecified: Secondary | ICD-10-CM | POA: Diagnosis not present

## 2018-05-29 ENCOUNTER — Telehealth: Payer: Self-pay

## 2018-05-29 NOTE — Telephone Encounter (Signed)
-----   Message from Jonelle Sidle, MD sent at 05/29/2018 11:38 AM EDT ----- Results reviewed.  Results would suggest that he does not require specific revascularization procedure at this time.  Recommend continued medical therapy and observation.  Please make sure that he has a routine follow-up visit scheduled. A copy of this test should be forwarded to Shawnie Dapper, PA-C.

## 2018-05-29 NOTE — Telephone Encounter (Signed)
Pt notified. Reminded of appt.

## 2018-06-09 ENCOUNTER — Ambulatory Visit: Payer: BLUE CROSS/BLUE SHIELD | Admitting: Student

## 2018-06-09 ENCOUNTER — Encounter: Payer: Self-pay | Admitting: Student

## 2018-06-09 ENCOUNTER — Encounter: Payer: Self-pay | Admitting: *Deleted

## 2018-06-09 VITALS — BP 146/88 | HR 71 | Ht 66.0 in | Wt 224.0 lb

## 2018-06-09 DIAGNOSIS — Q245 Malformation of coronary vessels: Secondary | ICD-10-CM

## 2018-06-09 DIAGNOSIS — I10 Essential (primary) hypertension: Secondary | ICD-10-CM

## 2018-06-09 DIAGNOSIS — Z79899 Other long term (current) drug therapy: Secondary | ICD-10-CM

## 2018-06-09 DIAGNOSIS — R079 Chest pain, unspecified: Secondary | ICD-10-CM

## 2018-06-09 NOTE — Patient Instructions (Signed)
Medication Instructions:  Your physician recommends that you continue on your current medications as directed. Please refer to the Current Medication list given to you today.  Take Lisinopril 40 mg Daily   If you need a refill on your cardiac medications before your next appointment, please call your pharmacy.   Lab work: Your physician recommends that you return for lab work in: 3-4 Weeks   If you have labs (blood work) drawn today and your tests are completely normal, you will receive your results only by: Marland Kitchen MyChart Message (if you have MyChart) OR . A paper copy in the mail If you have any lab test that is abnormal or we need to change your treatment, we will call you to review the results.  Testing/Procedures: NONE   Follow-Up: At [facility], you and your health needs are our priority.  As part of our continuing mission to provide you with exceptional heart care, we have created designated Provider Care Teams.  These Care Teams include your primary Cardiologist (physician) and Advanced Practice Providers (APPs -  Physician Assistants and Nurse Practitioners) who all work together to provide you with the care you need, when you need it. You will need a follow up appointment in 4 months.  Please call our office 2 months in advance to schedule this appointment.  You may see Nona Dell, MD or one of the following Advanced Practice Providers on your designated Care Team:   Randall An, PA-C [facility]) . Jacolyn Reedy, PA-C [facility])  Any Other Special Instructions Will Be Listed Below (If Applicable). Thank you for choosing [facility]!

## 2018-06-09 NOTE — Progress Notes (Addendum)
dd   Cardiology Office Note    Date:  06/09/2018   ID:  Duane Crosby, DOB 08-18-1970, MRN [id number]  PCP:  Shawnie Dapper, PA-C  Cardiologist: Nona Dell, MD    Chief Complaint  Patient presents with  . Follow-up    recent Coronary CT    History of Present Illness:    Duane Crosby is a 47 y.o. male with past medical history of HTN, HLD, and obesity who presents to the office today from follow-up of his recent coronary CT.  He was last examined by Lucile Crater, PA-C on 04/03/2018 in regards to a recent cardiac catheterization which showed no evidence of disease along the LAD or LCx but the RCA was unable to be fully visualized despite multiple attempts and a Coronary CTA was recommended for further evaluation.  Recommendations for this were reviewed with the patient of his office visit and he was in agreement to proceed.  He was also started on empiric trial of Zantac 150 mg daily and was restarted on Lisinopril at a lower dose of 20 mg daily as he had been without the medication for several weeks.  Coronary CT was performed in 05/26/2018 and showed no obstructive CAD but the RCA originated form the left cusp and coursed between the ascending aorta and RVOT. The proximal RCA did not appear compressed by its course between ascending aorta and RVOT. CT FFR was obtained and did not show any significant stenosis. Over-read by Radiology did not show any significant findings. His RCA findings were reviewed with Dr. Cornelius Moras and Dr. Clifton James and continued medical management was recommended given no history of arrhythmias or syncope and recent Treadmill Myoview showing no significant arrhythmias or EKG changes.   An interpreter is used during today's encounter.  In talking with the patient today, he reports that he was evaluated at [facility] over a month ago for recurrent chest discomfort but that everything looked good and he was discharged home. Unfortunately, records are not  available from his ED visit.  He says that he has been in his normal state of health since and denies any recurrent chest pain. He was taking Zantac daily but stopped this a few weeks ago due to not feeling that it was helping his symptoms and he denies any recurrent pain since discontinuation of the medication. No recent dyspnea on exertion, orthopnea, PND, lower extremity edema, dizziness, or prescynope.  He reports checking his blood pressure at home and SBP is usually in the 130's to 140's. BP is elevated to 146/88 during today's visit. Currently listed as taking Lisinopril 40mg  daily but he is unsure if he has been taking 20mg  daily or 40mg  daily.   Past Medical History:  Diagnosis Date  . Abnormal stress test   . Coronary artery anomaly, congenital    a. 05/2018: Coronary CT showing  RCA originated form the left cusp and coursed between the ascending aorta and RVOT.  . Essential hypertension   . Hyperlipidemia   . Obesity     Past Surgical History:  Procedure Laterality Date  . LEFT HEART CATH AND CORONARY ANGIOGRAPHY N/A 03/19/2018   Procedure: LEFT HEART CATH AND CORONARY ANGIOGRAPHY;  Surgeon: Kathleene Hazel, MD;  Location: [facility] INVASIVE CV LAB;  Service: Cardiovascular;  Laterality: N/A;    Current Medications: Outpatient Medications Prior to Visit  Medication Sig Dispense Refill  . aspirin EC 81 MG tablet Take 1 tablet (81 mg total) by mouth daily. 90 tablet 3  .  atorvastatin (LIPITOR) 40 MG tablet Take 40 mg by mouth daily.    Marland Kitchen diltiazem (CARDIZEM CD) 240 MG 24 hr capsule Take 240 mg by mouth daily.    Marland Kitchen lisinopril (PRINIVIL,ZESTRIL) 40 MG tablet Take 1 tablet (40 mg total) by mouth daily. 90 tablet 3  . ranitidine (ZANTAC) 150 MG tablet Take 1 tablet (150 mg total) by mouth daily. 90 tablet 3  . metoprolol tartrate (LOPRESSOR) 50 MG tablet Take 1 tablet (50 mg total) by mouth once for 1 dose. One hour before your test 1 tablet 0   No facility-administered  medications prior to visit.      Allergies:   Patient has no known allergies.   Social History   Socioeconomic History  . Marital status: Married    Spouse name: [name]  . Number of children: [name]  . Years of education: [name]  . Highest education level: [name]  Occupational History  . [name]  Social Needs  . Financial resource strain: [name]  . Food insecurity:    Worry: [name]    Inability: [name]  . Transportation needs:    Medical: [name]    Non-medical: [name]  Tobacco Use  . Smoking status: Former Games developer  . Smokeless tobacco: Never Used  . Tobacco comment: very rarely  Substance and Sexual Activity  . Alcohol use: Yes    Alcohol/week: 0.0 standard drinks  . Drug use: No  . Sexual activity: [name]  Lifestyle  . Physical activity:    Days per week: [name]    Minutes per session: [name]  . Stress: [name]  Relationships  . Social connections:    Talks on phone: [name]    Gets together: [name]    Attends religious service: [name]    Active member of club or organization: [name]    Attends meetings of clubs or organizations: [name]    Relationship status: [name]  Other Topics Concern  . [name]  Social History Narrative  . [name]     Family History:  The patient's family history includes Diabetes Mellitus II in his father; Hypertension in his brother and father.   Review of Systems:   Please see the history of present illness.     General:  No chills, fever, night sweats or weight changes.  Cardiovascular:  No dyspnea on exertion, edema, orthopnea, palpitations, paroxysmal nocturnal dyspnea. Positive for chest pain.  Dermatological: No rash, lesions/masses Respiratory: No cough, dyspnea Urologic: No hematuria, dysuria Abdominal:   No nausea, vomiting, diarrhea, bright red blood per rectum, melena, or hematemesis Neurologic:  No visual changes,  wkns, changes in mental status.  All other systems reviewed and are otherwise negative except as noted above.   Physical Exam:    VS:  BP (!) 146/88   Pulse 71   Ht 5\' 6"  (1.676 m)   Wt 224 lb (101.6 kg)   SpO2 96%   BMI 36.15 kg/m    General: Well developed, well nourished male appearing in no acute distress. Head: Normocephalic, atraumatic, sclera non-icteric, no xanthomas, nares are without discharge.  Neck: No carotid bruits. JVD not elevated.  Lungs: Respirations regular and unlabored, without wheezes or rales.  Heart: Regular rate and rhythm. No S3 or S4.  No murmur, no rubs, or  gallops appreciated. Abdomen: Soft, non-tender, non-distended with normoactive bowel sounds. No hepatomegaly. No rebound/guarding. No obvious abdominal masses. Msk:  Strength and tone appear normal for age. No joint deformities or effusions. Extremities: No clubbing or cyanosis. No lower extremity edema.  Distal pedal pulses are 2+ bilaterally. Neuro: Alert and oriented X 3. Moves all extremities spontaneously. No focal deficits noted. Psych:  Responds to questions appropriately with a normal affect. Skin: No rashes or lesions noted  Wt Readings from Last 3 Encounters:  06/09/18 224 lb (101.6 kg)  04/03/18 221 lb (100.2 kg)  03/19/18 218 lb (98.9 kg)     Studies/Labs Reviewed:   EKG:  EKG is not ordered today.   Recent Labs: 04/03/2018: ALT 68; BUN 11; Creatinine, Ser 0.67; Potassium 3.5; Sodium 140; TSH 2.312   Lipid Panel No results found for: CHOL, TRIG, HDL, CHOLHDL, VLDL, LDLCALC, LDLDIRECT  Additional studies/ records that were reviewed today include:   Cardiac Catheterization: 03/2018  The left ventricular systolic function is normal.  LV end diastolic pressure is normal.  The left ventricular ejection fraction is greater than 65% by visual estimate.  There is no mitral valve regurgitation.   1. No evidence of disease in the LAD or circumflex 2. I was unable to fully  visualize the RCA despite many attempts with many different catheters from the right radial and right femoral artery approach. The RCA appears to be a large caliber vessel that arises from a high anterior location or possibly anomalous from the left coronary cusp. Given the contrast load used, I stopped the case.   Recommendations: Unable to fully visualize the RCA despite many attempts. No disease in the LAD or Circumflex. I would recommend a coronary CTA in several weeks to better assess his RCA.   Coronary CT: 05/26/2018  FINDINGS: Non-cardiac: See separate report from [facility].  Pulmonary veins drained normally to the left atrium.  Calcium Score: 7 Agatston units.  Coronary Arteries: The right coronary orginated from the left cusp.  LM: No plaque or stenosis.  LAD system: Mixed plaque proximal LAD, no stenosis.  Circumflex system: No plaque or stenosis. Misregistration artifact noted.  RCA system: The RCA arises from the left cusp and courses between the ascending aorta and the RV outflow tract (below the pulmonary valve). The proximal RCA does appear compressed/smaller between ascending aorta and RVOT compared to the remainder of the vessel. There was mixed plaque with mild stenosis in the proximal RCA.  IMPRESSION: 1. Coronary artery calcium score 7 Agatston units. This places the patient in the 76th percentile for age and gender, suggesting intermediate risk of future cardiac events.  2.  No obstructive CAD.  3. 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 The proximal RCA does appear compressed by its course between ascending aorta and RVOT.   FINDINGS: FFRct analysis was performed on the original cardiac CT angiogram dataset. Diagrammatic representation of the FFRct analysis is provided in a separate PDF document in PACS. This dictation was created using the PDF document and an interactive  3D model of the results. 3D model is not available in the EMR/PACS. Normal FFR range is >0.80.  1. Left Main:  No significant stenosis.  2. LAD: No significant stenosis. 3. LCX: No significant stenosis. 4. RCA: CT FFR 0.84.  IMPRESSION: 1. CT FFR analysis didn't show any significant stenosis. Aggressive medical management is recommended.   Assessment:    1. Chest pain, unspecified type  2. Abnormal course of coronary artery   3. Essential hypertension   4. Medication management      Plan:   In order of problems listed above:  1. Atypical Chest Pain/ Abnormal Course of Coronary Artery - recent catheterization showed no evidence of disease along the LAD or LCx but the RCA was unable to be fully visualized despite multiple attempts. Coronary CTA showed no obstructive CAD but the RCA originated form the left cusp and coursed between the ascending aorta and RVOT. The proximal RCA did not appear compressed by its course between ascending aorta and RVOT. Continued medical management was recommended given no history of arrhythmias or syncope. - Results reviewed with the patient today with the assistance of a Spanish interpreter. He voiced understanding of this and denies any recurrent symptoms over the past several weeks. Will continue on ASA and statin therapy.  2. HTN - BP is elevated to 146/88 during today's visit. He is currently listed as taking Lisinopril 40mg  daily but he is unsure if he has been taking 20mg  daily or 40mg  daily. Encouraged him to titrate this to 40mg  daily. Will plan for a repeat BMET in 2-3 weeks to reassess kidney function and electrolytes. Has also remained on Cardizem CD 240mg  daily. Will continue for now given his elevated readings.   ADDENDUM: Records received from [facility] and reviewed. He presented there on 05/16/2018 for an episode of intermittent chest discomfort. He denied any associated symptoms at that time by review of documentation. Labs  showed no acute findings and initial and delta troponin values were negative. EKG showed no acute findings and he was informed to follow-up with Cardiology as an outpatient.   Medication Adjustments/Labs and Tests Ordered: Current medicines are reviewed at length with the patient today.  Concerns regarding medicines are outlined above.  Medication changes, Labs and Tests ordered today are listed in the Patient Instructions below. Patient Instructions  Medication Instructions:  Your physician recommends that you continue on your current medications as directed. Please refer to the Current Medication list given to you today.  Take Lisinopril 40 mg Daily   If you need a refill on your cardiac medications before your next appointment, please call your pharmacy.   Lab work: Your physician recommends that you return for lab work in: 3-4 Weeks   If you have labs (blood work) drawn today and your tests are completely normal, you will receive your results only by: Marland Kitchen MyChart Message (if you have MyChart) OR . A paper copy in the mail If you have any lab test that is abnormal or we need to change your treatment, we will call you to review the results.  Testing/Procedures: NONE   Follow-Up: At [facility], you and your health needs are our priority.  As part of our continuing mission to provide you with exceptional heart care, we have created designated Provider Care Teams.  These Care Teams include your primary Cardiologist (physician) and Advanced Practice Providers (APPs -  Physician Assistants and Nurse Practitioners) who all work together to provide you with the care you need, when you need it. You will need a follow up appointment in 4 months.  Please call our office 2 months in advance to schedule this appointment.  You may see Nona Dell, MD or one of the following Advanced Practice Providers on your designated Care Team:   Randall An, PA-C [facility] . Jacolyn Reedy,  PA-C [facility] Office)  Any Other Special Instructions Will Be Listed Below (  If Applicable). Thank you for choosing [facility]!        Signed, Ellsworth Lennox, PA-C  06/09/2018 5:09 PM     [facility] [address] [place], [place] [address] Phone: [phone]

## 2018-09-10 ENCOUNTER — Other Ambulatory Visit: Payer: Self-pay

## 2018-09-10 ENCOUNTER — Emergency Department (HOSPITAL_COMMUNITY): Payer: BLUE CROSS/BLUE SHIELD

## 2018-09-10 ENCOUNTER — Emergency Department (HOSPITAL_COMMUNITY)
Admission: EM | Admit: 2018-09-10 | Discharge: 2018-09-10 | Disposition: A | Discharge status: Home / Self Care | Payer: BLUE CROSS/BLUE SHIELD | Attending: Emergency Medicine | Admitting: Emergency Medicine

## 2018-09-10 ENCOUNTER — Encounter (HOSPITAL_COMMUNITY): Payer: Self-pay

## 2018-09-10 DIAGNOSIS — R079 Chest pain, unspecified: Secondary | ICD-10-CM

## 2018-09-10 DIAGNOSIS — Z7982 Long term (current) use of aspirin: Secondary | ICD-10-CM | POA: Insufficient documentation

## 2018-09-10 DIAGNOSIS — R1013 Epigastric pain: Secondary | ICD-10-CM | POA: Diagnosis not present

## 2018-09-10 DIAGNOSIS — I10 Essential (primary) hypertension: Secondary | ICD-10-CM | POA: Diagnosis not present

## 2018-09-10 DIAGNOSIS — I2510 Atherosclerotic heart disease of native coronary artery without angina pectoris: Secondary | ICD-10-CM | POA: Insufficient documentation

## 2018-09-10 DIAGNOSIS — Z79899 Other long term (current) drug therapy: Secondary | ICD-10-CM | POA: Diagnosis not present

## 2018-09-10 DIAGNOSIS — Q245 Malformation of coronary vessels: Secondary | ICD-10-CM | POA: Diagnosis not present

## 2018-09-10 DIAGNOSIS — Z87891 Personal history of nicotine dependence: Secondary | ICD-10-CM | POA: Insufficient documentation

## 2018-09-10 DIAGNOSIS — R0789 Other chest pain: Secondary | ICD-10-CM | POA: Insufficient documentation

## 2018-09-10 DIAGNOSIS — R05 Cough: Secondary | ICD-10-CM | POA: Diagnosis not present

## 2018-09-10 LAB — BASIC METABOLIC PANEL
Anion gap: 7 (ref 5–15)
BUN: 13 mg/dL (ref 6–20)
CO2: 23 mmol/L (ref 22–32)
Calcium: 9.2 mg/dL (ref 8.9–10.3)
Chloride: 110 mmol/L (ref 98–111)
Creatinine, Ser: 0.71 mg/dL (ref 0.61–1.24)
GFR calc Af Amer: >60 mL/min (ref >60)
GLUCOSE: 117 mg/dL — AB (ref 70–99)
POTASSIUM: 3.6 mmol/L (ref 3.5–5.1)
Sodium: 140 mmol/L (ref 135–145)

## 2018-09-10 LAB — CBC
HCT: 45.3 % (ref 39.0–52.0)
HEMOGLOBIN: 14.9 g/dL (ref 13.0–17.0)
MCH: 30.9 pg (ref 26.0–34.0)
MCHC: 32.9 g/dL (ref 30.0–36.0)
MCV: 94 fL (ref 80.0–100.0)
Platelets: 172 10*3/uL (ref 150–400)
RBC: 4.82 MIL/uL (ref 4.22–5.81)
RDW: 12.6 % (ref 11.5–15.5)
WBC: 4.4 10*3/uL (ref 4.0–10.5)
nRBC: 0 % (ref 0.0–0.2)

## 2018-09-10 LAB — HEPATIC FUNCTION PANEL
ALT: 83 U/L — ABNORMAL HIGH (ref 0–44)
AST: 66 U/L — ABNORMAL HIGH (ref 15–41)
Albumin: 4.3 g/dL (ref 3.5–5.0)
Alkaline Phosphatase: 79 U/L (ref 38–126)
Bilirubin, Direct: 0.1 mg/dL (ref 0.0–0.2)
Indirect Bilirubin: 0.5 mg/dL (ref 0.3–0.9)
Total Bilirubin: 0.6 mg/dL (ref 0.3–1.2)
Total Protein: 8 g/dL (ref 6.5–8.1)

## 2018-09-10 LAB — TROPONIN I
Troponin I: <0.03 ng/mL (ref <0.03)
Troponin I: <0.03 ng/mL (ref <0.03)

## 2018-09-10 LAB — LIPASE, BLOOD: Lipase: 32 U/L (ref 11–51)

## 2018-09-10 NOTE — ED Notes (Signed)
Patient transported to X-ray 

## 2018-09-10 NOTE — ED Provider Notes (Signed)
[facility] EMERGENCY DEPARTMENT Provider Note   CSN: [id number] Arrival date & time: 09/10/18  8185     History   Chief Complaint Chief Complaint  Patient presents with  . Chest Pain    HPI Duane Crosby is a 48 y.o. male.  The history is provided by the patient. No language interpreter was used.  Chest Pain  Pain location:  Epigastric and L lateral chest Pain quality: aching   Pain radiates to:  Does not radiate Pain severity:  No pain Timing:  Constant Progression:  Worsening Chronicity:  New Relieved by:  Nothing Worsened by:  Nothing Ineffective treatments:  None tried   Past Medical History:  Diagnosis Date  . Abnormal stress test   . Coronary artery anomaly, congenital    a. 05/2018: Coronary CT showing  RCA originated form the left cusp and coursed between the ascending aorta and RVOT.  . Essential hypertension   . Hyperlipidemia   . Obesity     Patient Active Problem List   Diagnosis Date Noted  . Abnormal stress test   . Precordial chest pain   . Hyperlipidemia 02/20/2018  . CAD in native artery 02/20/2018  . Essential hypertension 02/20/2018  . Atypical chest pain 02/20/2018    Past Surgical History:  Procedure Laterality Date  . LEFT HEART CATH AND CORONARY ANGIOGRAPHY N/A 03/19/2018   Procedure: LEFT HEART CATH AND CORONARY ANGIOGRAPHY;  Surgeon: Kathleene Hazel, MD;  Location: [facility] INVASIVE CV LAB;  Service: Cardiovascular;  Laterality: N/A;        Home Medications    Prior to Admission medications   Medication Sig Start Date End Date Taking? Authorizing Provider  aspirin EC 81 MG tablet Take 1 tablet (81 mg total) by mouth daily. 02/20/18  Yes Dunn, Dayna N, PA-C  atorvastatin (LIPITOR) 40 MG tablet Take 40 mg by mouth daily.   Yes [provider]  diltiazem (CARDIZEM CD) 240 MG 24 hr capsule Take 240 mg by mouth daily.   Yes [provider]  lisinopril (PRINIVIL,ZESTRIL) 40 MG tablet Take 1 tablet (40 mg  total) by mouth daily. 04/03/18 09/10/18 Yes Dunn, Dayna N, PA-C  Multiple Vitamins-Minerals (CENTRUM SILVER 50+MEN) TABS Take 1 tablet by mouth daily.   Yes [provider]  ranitidine (ZANTAC) 150 MG tablet Take 1 tablet (150 mg total) by mouth daily. 04/03/18  Yes Dunn, Tacey Ruiz, PA-C    Family History Family History  Problem Relation Age of Onset  . Hypertension Father   . Diabetes Mellitus II Father   . Hypertension Brother     Social History Social History   Tobacco Use  . Smoking status: Former Games developer  . Smokeless tobacco: Never Used  . Tobacco comment: very rarely  Substance Use Topics  . Alcohol use: Yes    Alcohol/week: 0.0 standard drinks    Comment: daily 6 pack per day.  . Drug use: No     Allergies   Patient has no known allergies.   Review of Systems Review of Systems  Cardiovascular: Positive for chest pain.  All other systems reviewed and are negative.    Physical Exam Updated Vital Signs BP 132/70   Pulse (!) 52   Temp 97.8 F (36.6 C) (Oral)   Resp 12   Ht 5\' 6"  (1.676 m)   Wt 95.3 kg   SpO2 100%   BMI 33.89 kg/m   Physical Exam Vitals signs reviewed.  HENT:     Head: Normocephalic.  Neck:     Musculoskeletal: Normal range of motion.  Cardiovascular:     Rate and Rhythm: Normal rate and regular rhythm.     Heart sounds: Normal heart sounds.  Pulmonary:     Effort: Pulmonary effort is normal.     Breath sounds: Normal breath sounds.  Chest:     Chest wall: No tenderness.  Abdominal:     General: Bowel sounds are normal.  Musculoskeletal: Normal range of motion.  Skin:    General: Skin is warm.  Neurological:     General: No focal deficit present.     Mental Status: He is alert.      ED Treatments / Results  Labs (all labs ordered are listed, but only abnormal results are displayed) Labs Reviewed  BASIC METABOLIC PANEL - Abnormal; Notable for the following components:      Result Value   Glucose, Bld 117 (*)     All other components within normal limits  HEPATIC FUNCTION PANEL - Abnormal; Notable for the following components:   AST 66 (*)    ALT 83 (*)    All other components within normal limits  CBC  TROPONIN I  LIPASE, BLOOD  TROPONIN I    EKG EKG Interpretation  Date/Time:  Thursday September 10 2018 08:50:03 EST Ventricular Rate:  66 PR Interval:    QRS Duration: 114 QT Interval:  398 QTC Calculation: 417 R Axis:   82 Text Interpretation:  Sinus rhythm Inferior infarct, old When compared with ECG of 04/03/2018 No significant change was found Confirmed by Samuel Jester [phone]) on 09/10/2018 9:01:29 AM   Radiology Dg Chest 2 View  Result Date: 09/10/2018 CLINICAL DATA:  Chest pain and cough for 7 days EXAM: CHEST - 2 VIEW COMPARISON:  05/16/2018 FINDINGS: Normal heart size and mediastinal contours. No acute infiltrate or edema. Hyperinflation. No effusion or pneumothorax. No acute osseous findings. IMPRESSION: No evidence of acute disease. Electronically Signed   By: Marnee Spring M.D.   On: 09/10/2018 09:40    Procedures Procedures (including critical care time)  Medications Ordered in ED Medications - No data to display   Initial Impression / Assessment and Plan / ED Course  I have reviewed the triage vital signs and the nursing notes.  Pertinent labs & imaging results that were available during my care of the patient were reviewed by me and considered in my medical decision making (see chart for details).     MDM EKG unchanged.  Troponin negative x 2.  Chest xray normal.   I reviewed pt's history. Pt advised to see his cardiologist for recheck.  No ekg changes,    Final Clinical Impressions(s) / ED Diagnoses   Final diagnoses:  Chest pain, unspecified type    ED Discharge Orders    None       Elson Areas, Cordelia Poche 09/10/18 1628    Samuel Jester, DO 09/12/18 1539

## 2018-09-10 NOTE — Discharge Instructions (Addendum)
Follow up with your Physician for recheck  

## 2018-09-10 NOTE — ED Triage Notes (Signed)
Pt c/o chest heaviness in center of chest x 1 week.  Reports has had intermittent chest pain x 2 years.  Denies n/v.  Reports sob and nonproductive cough.  Denies fever.

## 2018-10-05 DIAGNOSIS — E6609 Other obesity due to excess calories: Secondary | ICD-10-CM | POA: Diagnosis not present

## 2018-10-05 DIAGNOSIS — Z1389 Encounter for screening for other disorder: Secondary | ICD-10-CM | POA: Diagnosis not present

## 2018-10-05 DIAGNOSIS — Z6834 Body mass index (BMI) 34.0-34.9, adult: Secondary | ICD-10-CM | POA: Diagnosis not present

## 2018-10-05 DIAGNOSIS — E782 Mixed hyperlipidemia: Secondary | ICD-10-CM | POA: Diagnosis not present

## 2018-10-08 ENCOUNTER — Ambulatory Visit: Payer: BLUE CROSS/BLUE SHIELD | Admitting: Cardiology

## 2018-10-29 DIAGNOSIS — Z681 Body mass index (BMI) 19 or less, adult: Secondary | ICD-10-CM | POA: Diagnosis not present

## 2018-10-29 DIAGNOSIS — J029 Acute pharyngitis, unspecified: Secondary | ICD-10-CM | POA: Diagnosis not present

## 2018-10-29 DIAGNOSIS — J301 Allergic rhinitis due to pollen: Secondary | ICD-10-CM | POA: Diagnosis not present

## 2018-11-16 ENCOUNTER — Ambulatory Visit: Payer: BLUE CROSS/BLUE SHIELD | Admitting: Cardiology

## 2018-12-29 DIAGNOSIS — E782 Mixed hyperlipidemia: Secondary | ICD-10-CM | POA: Diagnosis not present

## 2018-12-29 DIAGNOSIS — R7309 Other abnormal glucose: Secondary | ICD-10-CM | POA: Diagnosis not present

## 2018-12-29 DIAGNOSIS — Z6836 Body mass index (BMI) 36.0-36.9, adult: Secondary | ICD-10-CM | POA: Diagnosis not present

## 2018-12-29 DIAGNOSIS — Z0000 Encounter for general adult medical examination without abnormal findings: Secondary | ICD-10-CM | POA: Diagnosis not present

## 2018-12-29 DIAGNOSIS — N183 Chronic kidney disease, stage 3 (moderate): Secondary | ICD-10-CM | POA: Diagnosis not present

## 2018-12-29 DIAGNOSIS — I10 Essential (primary) hypertension: Secondary | ICD-10-CM | POA: Diagnosis not present

## 2018-12-29 DIAGNOSIS — E6609 Other obesity due to excess calories: Secondary | ICD-10-CM | POA: Diagnosis not present

## 2018-12-29 DIAGNOSIS — Z1389 Encounter for screening for other disorder: Secondary | ICD-10-CM | POA: Diagnosis not present

## 2019-01-14 ENCOUNTER — Telehealth: Payer: Self-pay | Admitting: *Deleted

## 2019-01-14 DIAGNOSIS — Z20822 Contact with and (suspected) exposure to covid-19: Secondary | ICD-10-CM

## 2019-01-14 DIAGNOSIS — E6609 Other obesity due to excess calories: Secondary | ICD-10-CM | POA: Diagnosis not present

## 2019-01-14 DIAGNOSIS — Z1389 Encounter for screening for other disorder: Secondary | ICD-10-CM | POA: Diagnosis not present

## 2019-01-14 DIAGNOSIS — Z6836 Body mass index (BMI) 36.0-36.9, adult: Secondary | ICD-10-CM | POA: Diagnosis not present

## 2019-01-14 DIAGNOSIS — M546 Pain in thoracic spine: Secondary | ICD-10-CM | POA: Diagnosis not present

## 2019-01-14 NOTE — Telephone Encounter (Addendum)
Contacted by Lorre Munroe, CCMA from [facility] on behalf of Dr Collene Mares due to positive exposure; pt's address and phone number verified; the pt can be contacted at [phone]; will attempt to contact pt.

## 2019-01-14 NOTE — Telephone Encounter (Signed)
Assisted by [employer] # [phone]; contacted pt to schedule testing; he would like to be seen on 01/15/2019; pt offered and accepted appointment at [facility] site 01/15/2019 at 0945; pt given address, location, and instructions that he and all occupants of his vehicle should masks; he verbalized understanding; orders placed per protocol

## 2019-01-15 ENCOUNTER — Other Ambulatory Visit: Payer: BLUE CROSS/BLUE SHIELD

## 2019-01-15 DIAGNOSIS — Z20822 Contact with and (suspected) exposure to covid-19: Secondary | ICD-10-CM

## 2019-01-15 DIAGNOSIS — R6889 Other general symptoms and signs: Secondary | ICD-10-CM | POA: Diagnosis not present

## 2019-01-17 LAB — NOVEL CORONAVIRUS, NAA: SARS-CoV-2, NAA: NOT DETECTED

## 2019-01-20 ENCOUNTER — Telehealth: Payer: Self-pay

## 2019-01-20 NOTE — Telephone Encounter (Signed)
Pt. Called back- given COVID 19 results.

## 2019-04-29 ENCOUNTER — Other Ambulatory Visit: Payer: Self-pay | Admitting: Physician Assistant

## 2019-09-27 ENCOUNTER — Other Ambulatory Visit: Payer: Self-pay | Admitting: Student

## 2020-01-27 DIAGNOSIS — E7849 Other hyperlipidemia: Secondary | ICD-10-CM | POA: Diagnosis not present

## 2020-01-27 DIAGNOSIS — I10 Essential (primary) hypertension: Secondary | ICD-10-CM | POA: Diagnosis not present

## 2020-01-27 DIAGNOSIS — Z0001 Encounter for general adult medical examination with abnormal findings: Secondary | ICD-10-CM | POA: Diagnosis not present

## 2020-01-27 DIAGNOSIS — Z6833 Body mass index (BMI) 33.0-33.9, adult: Secondary | ICD-10-CM | POA: Diagnosis not present

## 2020-01-27 DIAGNOSIS — Z0000 Encounter for general adult medical examination without abnormal findings: Secondary | ICD-10-CM | POA: Diagnosis not present

## 2020-01-27 DIAGNOSIS — R634 Abnormal weight loss: Secondary | ICD-10-CM | POA: Diagnosis not present

## 2020-01-27 DIAGNOSIS — R7309 Other abnormal glucose: Secondary | ICD-10-CM | POA: Diagnosis not present

## 2020-01-27 DIAGNOSIS — M25561 Pain in right knee: Secondary | ICD-10-CM | POA: Diagnosis not present

## 2020-03-29 DIAGNOSIS — G4489 Other headache syndrome: Secondary | ICD-10-CM | POA: Diagnosis not present

## 2020-03-29 DIAGNOSIS — I10 Essential (primary) hypertension: Secondary | ICD-10-CM | POA: Diagnosis not present

## 2020-03-29 DIAGNOSIS — R519 Headache, unspecified: Secondary | ICD-10-CM | POA: Diagnosis not present

## 2020-06-13 DIAGNOSIS — I10 Essential (primary) hypertension: Secondary | ICD-10-CM | POA: Diagnosis not present

## 2020-06-13 DIAGNOSIS — J029 Acute pharyngitis, unspecified: Secondary | ICD-10-CM | POA: Diagnosis not present

## 2020-06-13 DIAGNOSIS — J329 Chronic sinusitis, unspecified: Secondary | ICD-10-CM | POA: Diagnosis not present

## 2020-09-01 DIAGNOSIS — R0789 Other chest pain: Secondary | ICD-10-CM | POA: Diagnosis not present

## 2020-09-01 DIAGNOSIS — I10 Essential (primary) hypertension: Secondary | ICD-10-CM | POA: Diagnosis not present

## 2020-09-01 DIAGNOSIS — I252 Old myocardial infarction: Secondary | ICD-10-CM | POA: Diagnosis not present

## 2020-09-01 DIAGNOSIS — R079 Chest pain, unspecified: Secondary | ICD-10-CM | POA: Diagnosis not present

## 2020-09-01 DIAGNOSIS — R9431 Abnormal electrocardiogram [ECG] [EKG]: Secondary | ICD-10-CM | POA: Diagnosis not present

## 2021-02-13 DIAGNOSIS — Z0001 Encounter for general adult medical examination with abnormal findings: Secondary | ICD-10-CM | POA: Diagnosis not present

## 2021-02-13 DIAGNOSIS — Z6833 Body mass index (BMI) 33.0-33.9, adult: Secondary | ICD-10-CM | POA: Diagnosis not present

## 2021-02-13 DIAGNOSIS — Z1389 Encounter for screening for other disorder: Secondary | ICD-10-CM | POA: Diagnosis not present

## 2021-02-13 DIAGNOSIS — E782 Mixed hyperlipidemia: Secondary | ICD-10-CM | POA: Diagnosis not present

## 2021-02-13 DIAGNOSIS — E6609 Other obesity due to excess calories: Secondary | ICD-10-CM | POA: Diagnosis not present

## 2021-02-13 DIAGNOSIS — M94261 Chondromalacia, right knee: Secondary | ICD-10-CM | POA: Diagnosis not present

## 2021-02-13 DIAGNOSIS — Z0000 Encounter for general adult medical examination without abnormal findings: Secondary | ICD-10-CM | POA: Diagnosis not present

## 2021-02-13 DIAGNOSIS — R7309 Other abnormal glucose: Secondary | ICD-10-CM | POA: Diagnosis not present

## 2021-02-13 DIAGNOSIS — Z1331 Encounter for screening for depression: Secondary | ICD-10-CM | POA: Diagnosis not present

## 2021-02-26 DIAGNOSIS — R300 Dysuria: Secondary | ICD-10-CM | POA: Diagnosis not present

## 2021-02-26 DIAGNOSIS — N419 Inflammatory disease of prostate, unspecified: Secondary | ICD-10-CM | POA: Diagnosis not present

## 2021-02-26 DIAGNOSIS — R7309 Other abnormal glucose: Secondary | ICD-10-CM | POA: Diagnosis not present

## 2021-02-26 DIAGNOSIS — Z6832 Body mass index (BMI) 32.0-32.9, adult: Secondary | ICD-10-CM | POA: Diagnosis not present

## 2021-02-26 DIAGNOSIS — I10 Essential (primary) hypertension: Secondary | ICD-10-CM | POA: Diagnosis not present

## 2021-02-26 DIAGNOSIS — E6609 Other obesity due to excess calories: Secondary | ICD-10-CM | POA: Diagnosis not present

## 2021-02-26 DIAGNOSIS — N183 Chronic kidney disease, stage 3 unspecified: Secondary | ICD-10-CM | POA: Diagnosis not present

## 2021-03-01 ENCOUNTER — Encounter (INDEPENDENT_AMBULATORY_CARE_PROVIDER_SITE_OTHER): Payer: Self-pay | Admitting: *Deleted

## 2021-04-19 DIAGNOSIS — L290 Pruritus ani: Secondary | ICD-10-CM | POA: Diagnosis not present

## 2021-04-19 DIAGNOSIS — Z23 Encounter for immunization: Secondary | ICD-10-CM | POA: Diagnosis not present

## 2021-04-19 DIAGNOSIS — Z6833 Body mass index (BMI) 33.0-33.9, adult: Secondary | ICD-10-CM | POA: Diagnosis not present

## 2021-04-19 DIAGNOSIS — E6609 Other obesity due to excess calories: Secondary | ICD-10-CM | POA: Diagnosis not present

## 2021-04-24 ENCOUNTER — Ambulatory Visit
Admission: EM | Admit: 2021-04-24 | Discharge: 2021-04-24 | Disposition: A | Discharge status: Home / Self Care | Payer: BC Managed Care – PPO | Attending: Family Medicine | Admitting: Family Medicine

## 2021-04-24 ENCOUNTER — Encounter: Payer: Self-pay | Admitting: Emergency Medicine

## 2021-04-24 DIAGNOSIS — M109 Gout, unspecified: Secondary | ICD-10-CM

## 2021-04-24 MED ORDER — DEXAMETHASONE SODIUM PHOSPHATE 10 MG/ML IJ SOLN
10.0000 mg | Freq: Once | INTRAMUSCULAR | Status: AC
  Administered 2021-04-24: 10 mg via INTRAMUSCULAR

## 2021-04-24 MED ORDER — COLCHICINE 0.6 MG PO TABS: ORAL_TABLET | ORAL | 0 refills | Status: DC

## 2021-04-24 NOTE — ED Triage Notes (Signed)
Pain and swelling to RT great toe.  Hx of gout

## 2021-04-24 NOTE — ED Provider Notes (Signed)
  [facility]   [id number] 04/24/21 Arrival Time: 0901  ASSESSMENT & PLAN:  1. Podagra    Work note provided. No signs of infection.  Meds ordered this encounter  Medications   colchicine 0.6 MG tablet    Sig: Take two tablets as one dose followed one hour later by one tablet.    Dispense:  3 tablet    Refill:  0   dexamethasone (DECADRON) injection 10 mg     Follow-up Information     Shawnie Dapper, PA-C.   Specialties: Physician Assistant, Internal Medicine Why: If worsening or failing to improve as anticipated. Contact information: [address] [place] [address] [phone]                 Reviewed expectations re: course of current medical issues. Questions answered. Outlined signs and symptoms indicating need for more acute intervention. Understanding verbalized. After Visit Summary given.   SUBJECTIVE: History from: patient. Duane REPETTO is a 50 y.o. male who reports gout flare. R great toe. Few d. Denies: fever. Normal PO intake without n/v/d.   OBJECTIVE:  Vitals:   04/24/21 1042  BP: (!) 156/98  Pulse: 62  Resp: 18  Temp: 98.2 F (36.8 C)  TempSrc: Oral  SpO2: 97%    General appearance: alert; no distress Extremities: no edema; pain over R great toe; mild swelling/erythema Skin: warm and dry Neurologic: normal gait Psychological: alert and cooperative; normal mood and affect    No Known Allergies  Past Medical History:  Diagnosis Date   Abnormal stress test    Coronary artery anomaly, congenital    a. 05/2018: Coronary CT showing  RCA originated form the left cusp and coursed between the ascending aorta and RVOT.   Essential hypertension    Hyperlipidemia    Obesity    Social History   Socioeconomic History   Marital status: Married    Spouse name: [name]   Number of children: [name]   Years of education: [name]   Highest education level: [name]  Occupational  History   [name]  Tobacco Use   Smoking status: Former   Smokeless tobacco: Never   Tobacco comments:    very rarely  Vaping Use   Vaping Use: Never used  Substance and Sexual Activity   Alcohol use: Yes    Alcohol/week: 0.0 standard drinks    Comment: daily 6 pack per day.   Drug use: No   Sexual activity: [name]  Other Topics Concern   [name]  Social History Narrative   [name]   Social Determinants of Health   Financial Resource Strain: [name]  Food Insecurity: [name]  Transportation Needs: [name]  Physical Activity: [name]  Stress: [name]  Social Connections: [name]  Intimate Partner Violence: [name]   Family History  Problem Relation Age of Onset   Hypertension Father    Diabetes Mellitus II Father    Hypertension Brother    Past Surgical History:  Procedure Laterality Date   LEFT HEART CATH AND CORONARY ANGIOGRAPHY N/A 03/19/2018   Procedure: LEFT HEART CATH AND CORONARY ANGIOGRAPHY;  Surgeon: Kathleene Hazel, MD;  Location: [facility];  Service: Cardiovascular;  Laterality: N/A;     Mardella Layman, MD 04/24/21 1114

## 2021-04-24 NOTE — Discharge Instructions (Addendum)
Meds ordered this encounter  Medications   colchicine 0.6 MG tablet    Sig: Take two tablets as one dose followed one hour later by one tablet.    Dispense:  3 tablet    Refill:  0   dexamethasone (DECADRON) injection 10 mg

## 2021-05-02 DIAGNOSIS — R198 Other specified symptoms and signs involving the digestive system and abdomen: Secondary | ICD-10-CM | POA: Diagnosis not present

## 2021-05-02 DIAGNOSIS — E782 Mixed hyperlipidemia: Secondary | ICD-10-CM | POA: Diagnosis not present

## 2021-05-02 DIAGNOSIS — M79671 Pain in right foot: Secondary | ICD-10-CM | POA: Diagnosis not present

## 2021-05-09 ENCOUNTER — Encounter (INDEPENDENT_AMBULATORY_CARE_PROVIDER_SITE_OTHER): Payer: Self-pay | Admitting: *Deleted

## 2021-07-15 DIAGNOSIS — M47816 Spondylosis without myelopathy or radiculopathy, lumbar region: Secondary | ICD-10-CM | POA: Diagnosis not present

## 2021-07-15 DIAGNOSIS — R0902 Hypoxemia: Secondary | ICD-10-CM | POA: Diagnosis not present

## 2021-07-15 DIAGNOSIS — Z041 Encounter for examination and observation following transport accident: Secondary | ICD-10-CM | POA: Diagnosis not present

## 2021-07-15 DIAGNOSIS — S2020XA Contusion of thorax, unspecified, initial encounter: Secondary | ICD-10-CM | POA: Diagnosis not present

## 2021-07-15 DIAGNOSIS — R0789 Other chest pain: Secondary | ICD-10-CM | POA: Diagnosis not present

## 2021-07-15 DIAGNOSIS — R079 Chest pain, unspecified: Secondary | ICD-10-CM | POA: Diagnosis not present

## 2021-07-15 DIAGNOSIS — S20212A Contusion of left front wall of thorax, initial encounter: Secondary | ICD-10-CM | POA: Diagnosis not present

## 2021-07-15 DIAGNOSIS — I10 Essential (primary) hypertension: Secondary | ICD-10-CM | POA: Diagnosis not present

## 2021-07-20 DIAGNOSIS — E6609 Other obesity due to excess calories: Secondary | ICD-10-CM | POA: Diagnosis not present

## 2021-07-20 DIAGNOSIS — M546 Pain in thoracic spine: Secondary | ICD-10-CM | POA: Diagnosis not present

## 2021-07-20 DIAGNOSIS — I10 Essential (primary) hypertension: Secondary | ICD-10-CM | POA: Diagnosis not present

## 2021-07-20 DIAGNOSIS — R0789 Other chest pain: Secondary | ICD-10-CM | POA: Diagnosis not present

## 2021-07-20 DIAGNOSIS — Z6833 Body mass index (BMI) 33.0-33.9, adult: Secondary | ICD-10-CM | POA: Diagnosis not present

## 2021-07-20 DIAGNOSIS — S20219A Contusion of unspecified front wall of thorax, initial encounter: Secondary | ICD-10-CM | POA: Diagnosis not present

## 2021-07-20 DIAGNOSIS — D485 Neoplasm of uncertain behavior of skin: Secondary | ICD-10-CM | POA: Diagnosis not present

## 2021-09-27 ENCOUNTER — Encounter (INDEPENDENT_AMBULATORY_CARE_PROVIDER_SITE_OTHER): Payer: Self-pay

## 2021-10-12 DIAGNOSIS — R072 Precordial pain: Secondary | ICD-10-CM | POA: Diagnosis not present

## 2021-10-12 DIAGNOSIS — I10 Essential (primary) hypertension: Secondary | ICD-10-CM | POA: Diagnosis not present

## 2021-10-12 DIAGNOSIS — R0602 Shortness of breath: Secondary | ICD-10-CM | POA: Diagnosis not present

## 2021-10-12 DIAGNOSIS — R059 Cough, unspecified: Secondary | ICD-10-CM | POA: Diagnosis not present

## 2021-10-12 DIAGNOSIS — R9431 Abnormal electrocardiogram [ECG] [EKG]: Secondary | ICD-10-CM | POA: Diagnosis not present

## 2021-10-12 DIAGNOSIS — E785 Hyperlipidemia, unspecified: Secondary | ICD-10-CM | POA: Diagnosis not present

## 2021-10-12 DIAGNOSIS — R0789 Other chest pain: Secondary | ICD-10-CM | POA: Diagnosis not present

## 2021-10-24 ENCOUNTER — Encounter (INDEPENDENT_AMBULATORY_CARE_PROVIDER_SITE_OTHER): Payer: Self-pay | Admitting: *Deleted

## 2022-01-18 DIAGNOSIS — M779 Enthesopathy, unspecified: Secondary | ICD-10-CM | POA: Diagnosis not present

## 2022-01-18 DIAGNOSIS — E6609 Other obesity due to excess calories: Secondary | ICD-10-CM | POA: Diagnosis not present

## 2022-01-18 DIAGNOSIS — Z6833 Body mass index (BMI) 33.0-33.9, adult: Secondary | ICD-10-CM | POA: Diagnosis not present

## 2022-01-18 DIAGNOSIS — N183 Chronic kidney disease, stage 3 unspecified: Secondary | ICD-10-CM | POA: Diagnosis not present

## 2022-01-18 DIAGNOSIS — I10 Essential (primary) hypertension: Secondary | ICD-10-CM | POA: Diagnosis not present

## 2022-02-15 DIAGNOSIS — Z0001 Encounter for general adult medical examination with abnormal findings: Secondary | ICD-10-CM | POA: Diagnosis not present

## 2022-02-15 DIAGNOSIS — E6609 Other obesity due to excess calories: Secondary | ICD-10-CM | POA: Diagnosis not present

## 2022-02-15 DIAGNOSIS — Z1331 Encounter for screening for depression: Secondary | ICD-10-CM | POA: Diagnosis not present

## 2022-02-15 DIAGNOSIS — B356 Tinea cruris: Secondary | ICD-10-CM | POA: Diagnosis not present

## 2022-02-15 DIAGNOSIS — I10 Essential (primary) hypertension: Secondary | ICD-10-CM | POA: Diagnosis not present

## 2022-02-15 DIAGNOSIS — Z6834 Body mass index (BMI) 34.0-34.9, adult: Secondary | ICD-10-CM | POA: Diagnosis not present

## 2022-02-15 DIAGNOSIS — N183 Chronic kidney disease, stage 3 unspecified: Secondary | ICD-10-CM | POA: Diagnosis not present

## 2022-02-15 DIAGNOSIS — B352 Tinea manuum: Secondary | ICD-10-CM | POA: Diagnosis not present

## 2022-02-15 DIAGNOSIS — E162 Hypoglycemia, unspecified: Secondary | ICD-10-CM | POA: Diagnosis not present

## 2022-02-18 DIAGNOSIS — N183 Chronic kidney disease, stage 3 unspecified: Secondary | ICD-10-CM | POA: Diagnosis not present

## 2022-02-18 DIAGNOSIS — E162 Hypoglycemia, unspecified: Secondary | ICD-10-CM | POA: Diagnosis not present

## 2022-02-18 DIAGNOSIS — I10 Essential (primary) hypertension: Secondary | ICD-10-CM | POA: Diagnosis not present

## 2022-02-18 DIAGNOSIS — Z0001 Encounter for general adult medical examination with abnormal findings: Secondary | ICD-10-CM | POA: Diagnosis not present

## 2022-03-06 ENCOUNTER — Ambulatory Visit (HOSPITAL_COMMUNITY)
Admission: RE | Admit: 2022-03-06 | Discharge: 2022-03-06 | Disposition: A | Discharge status: Home / Self Care | Payer: BC Managed Care – PPO | Source: Ambulatory Visit | Attending: Internal Medicine | Admitting: Internal Medicine

## 2022-03-06 ENCOUNTER — Other Ambulatory Visit (HOSPITAL_COMMUNITY): Payer: Self-pay | Admitting: Internal Medicine

## 2022-03-06 DIAGNOSIS — I10 Essential (primary) hypertension: Secondary | ICD-10-CM | POA: Diagnosis not present

## 2022-03-06 DIAGNOSIS — R0781 Pleurodynia: Secondary | ICD-10-CM | POA: Insufficient documentation

## 2022-03-06 DIAGNOSIS — M940 Chondrocostal junction syndrome [Tietze]: Secondary | ICD-10-CM | POA: Diagnosis not present

## 2022-03-06 DIAGNOSIS — E6609 Other obesity due to excess calories: Secondary | ICD-10-CM | POA: Diagnosis not present

## 2022-03-06 DIAGNOSIS — R079 Chest pain, unspecified: Secondary | ICD-10-CM | POA: Diagnosis not present

## 2022-03-06 DIAGNOSIS — Z6835 Body mass index (BMI) 35.0-35.9, adult: Secondary | ICD-10-CM | POA: Diagnosis not present

## 2022-03-06 DIAGNOSIS — N183 Chronic kidney disease, stage 3 unspecified: Secondary | ICD-10-CM | POA: Diagnosis not present

## 2022-04-18 DIAGNOSIS — Z0001 Encounter for general adult medical examination with abnormal findings: Secondary | ICD-10-CM | POA: Diagnosis not present

## 2022-04-18 DIAGNOSIS — R7989 Other specified abnormal findings of blood chemistry: Secondary | ICD-10-CM | POA: Diagnosis not present

## 2022-04-24 ENCOUNTER — Other Ambulatory Visit (HOSPITAL_COMMUNITY): Payer: Self-pay | Admitting: Internal Medicine

## 2022-04-24 ENCOUNTER — Other Ambulatory Visit: Payer: Self-pay | Admitting: Internal Medicine

## 2022-04-24 DIAGNOSIS — R7989 Other specified abnormal findings of blood chemistry: Secondary | ICD-10-CM

## 2022-05-07 ENCOUNTER — Ambulatory Visit (HOSPITAL_COMMUNITY)
Admission: RE | Admit: 2022-05-07 | Discharge: 2022-05-07 | Disposition: A | Discharge status: Home / Self Care | Payer: BC Managed Care – PPO | Source: Ambulatory Visit | Attending: Internal Medicine | Admitting: Internal Medicine

## 2022-05-07 DIAGNOSIS — K760 Fatty (change of) liver, not elsewhere classified: Secondary | ICD-10-CM | POA: Diagnosis not present

## 2022-05-07 DIAGNOSIS — R7989 Other specified abnormal findings of blood chemistry: Secondary | ICD-10-CM | POA: Diagnosis not present

## 2022-06-18 ENCOUNTER — Encounter (INDEPENDENT_AMBULATORY_CARE_PROVIDER_SITE_OTHER): Payer: Self-pay | Admitting: *Deleted

## 2022-07-02 ENCOUNTER — Telehealth (INDEPENDENT_AMBULATORY_CARE_PROVIDER_SITE_OTHER): Payer: Self-pay | Admitting: *Deleted

## 2022-07-02 NOTE — Telephone Encounter (Signed)
LMTCB

## 2022-07-02 NOTE — Telephone Encounter (Signed)
Any room 

## 2022-07-02 NOTE — Telephone Encounter (Signed)
  Procedure: Colonoscopy  Have you had a colonoscopy before?  no  Do you have family history of colon cancer?  no  Do you have a family history of polyps? no  Do you have a history colorectal cancer?   no  Are you diabetic?  no  Do you have a prosthetic or mechanical heart valve? no  Do you have a pacemaker/defibrillator?   no  Have you had endocarditis/atrial fibrillation?  no  Do you use supplemental oxygen/CPAP?  no  Have you had joint replacement within the last 12 months?  no  Do you tend to be constipated or have to use laxatives?  no   Do you have history of alcohol use? If yes, how much and how often.  no  Do you have history or are you using drugs? If yes, what do are you  using?  no  Have you ever had a stroke/heart attack?  no  Have you ever had a heart or other vascular stent placed,?no  Do you take weight loss medication? no  Do you take any blood-thinning medications such as: (Plavix, aspirin, Coumadin, Aggrenox, Brilinta, Xarelto, Eliquis, Pradaxa, Savaysa or Effient)? no  If yes we need the name, milligram, dosage and who is prescribing doctor:               Current Outpatient Medications  Medication Sig Dispense Refill   atorvastatin (LIPITOR) 10 MG tablet Take 10 mg by mouth daily.     lisinopril (ZESTRIL) 40 MG tablet TAKE 1 TABLET BY MOUTH EVERY DAY 60 tablet 0   No current facility-administered medications for this visit.    No Known Allergies

## 2022-07-03 NOTE — Telephone Encounter (Signed)
LMTCB. Letter mailed.  

## 2022-11-14 DIAGNOSIS — Z6834 Body mass index (BMI) 34.0-34.9, adult: Secondary | ICD-10-CM | POA: Diagnosis not present

## 2022-11-14 DIAGNOSIS — J029 Acute pharyngitis, unspecified: Secondary | ICD-10-CM | POA: Diagnosis not present

## 2022-11-14 DIAGNOSIS — E6609 Other obesity due to excess calories: Secondary | ICD-10-CM | POA: Diagnosis not present

## 2023-01-02 DIAGNOSIS — B356 Tinea cruris: Secondary | ICD-10-CM | POA: Diagnosis not present

## 2023-01-02 DIAGNOSIS — E6609 Other obesity due to excess calories: Secondary | ICD-10-CM | POA: Diagnosis not present

## 2023-01-02 DIAGNOSIS — Z6833 Body mass index (BMI) 33.0-33.9, adult: Secondary | ICD-10-CM | POA: Diagnosis not present

## 2023-01-02 DIAGNOSIS — I10 Essential (primary) hypertension: Secondary | ICD-10-CM | POA: Diagnosis not present

## 2023-01-02 DIAGNOSIS — N183 Chronic kidney disease, stage 3 unspecified: Secondary | ICD-10-CM | POA: Diagnosis not present

## 2023-01-02 DIAGNOSIS — K640 First degree hemorrhoids: Secondary | ICD-10-CM | POA: Diagnosis not present

## 2023-01-15 ENCOUNTER — Encounter: Payer: Self-pay | Admitting: Gastroenterology

## 2023-02-04 ENCOUNTER — Ambulatory Visit: Payer: BC Managed Care – PPO | Admitting: Gastroenterology

## 2023-02-11 NOTE — H&P (View-Only) (Signed)
Referring Provider: Elfredia Nevins, MD Primary Care Physician:  Elfredia Nevins, MD Primary Gastroenterologist:  Dr. Levon Hedger  Chief Complaint  Patient presents with   Hemorrhoids    Consult TCS never had prior but had hemorrhoid 6 weeks ago and reports it busted. Was given cream. Has some constipation    HPI:   Duane Crosby is a 52 y.o. male presenting today at the request of  Elfredia Nevins, MD for consult colonoscopy and hemorrhoids.   No prior colonoscopy.  Reports he had a hemorrhoid 6 weeks ago that was swollen and ruptured.  PCP prescribed some cream for this and rectal bleeding has resolved. No rectal pain. He does have some occasional trouble with constipation and takes Metamucil, but not daily.  On average, he has bowel movement every day, sometimes twice daily.  No abdominal pain, nausea, vomiting. Occasional reflux if eating spicy foods. No dysphagia.   No Fhx of colon cancer.   Past Medical History:  Diagnosis Date   Abnormal stress test    Coronary artery anomaly, congenital    a. 05/2018: Coronary CT showing  RCA originated form the left cusp and coursed between the ascending aorta and RVOT.   Essential hypertension    Hyperlipidemia    Obesity     Past Surgical History:  Procedure Laterality Date   LEFT HEART CATH AND CORONARY ANGIOGRAPHY N/A 03/19/2018   Procedure: LEFT HEART CATH AND CORONARY ANGIOGRAPHY;  Surgeon: Kathleene Hazel, MD;  Location: [facility];  Service: Cardiovascular;  Laterality: N/A;    Current Outpatient Medications  Medication Sig Dispense Refill   atorvastatin (LIPITOR) 10 MG tablet Take 10 mg by mouth daily.     lisinopril (ZESTRIL) 40 MG tablet Take 40 mg by mouth daily.     Psyllium (METAMUCIL PO) Take by mouth. As needed     No current facility-administered medications for this visit.    Allergies as of 02/13/2023   (No Known Allergies)    Family History  Problem Relation Age of Onset    Hypertension Father    Diabetes Mellitus II Father    Hypertension Brother    Colon cancer Neg Hx     Social History   Socioeconomic History   Marital status: Married    Spouse name: [name]   Number of children: [name]   Years of education: [name]   Highest education level: [name]  Occupational History   [name]  Tobacco Use   Smoking status: Former   Smokeless tobacco: Never   Tobacco comments:    very rarely  Vaping Use   Vaping status: Never Used  Substance and Sexual Activity   Alcohol use: Yes    Comment: 3-4 drinks per day   Drug use: No   Sexual activity: [name]  Other Topics Concern   [name]  Social History Narrative   [name]   Social Determinants of Health   Financial Resource Strain: [name]  Food Insecurity: [name]  Transportation Needs: [name]  Physical Activity: [name]  Stress: [name]  Social Connections: [name]  Intimate Partner Violence: [name]    Review of Systems: Gen: Denies any fever, chills, cold or flu like symptoms, pre-syncope, or syncope.  CV: Denies chest pain, heart palpitations. Resp: Denies shortness of breath, cough.  GI: See HPI GU : Denies urinary burning, urinary  frequency, urinary hesitancy MS: Denies joint pain. Derm: Denies rash. Psych: Denies depression, anxiety. Heme: See HPI  Physical Exam: BP 135/76   Pulse 73   Temp 98.5 F (36.9 C)   Ht 5\' 6"  (1.676 m)   Wt 210 lb (95.3 kg)   BMI 33.89 kg/m  General:   Alert and oriented. Pleasant and cooperative. Well-nourished and well-developed.  Head:  Normocephalic and atraumatic. Eyes:  Without icterus, sclera clear and conjunctiva pink.  Ears:  Normal auditory acuity. Lungs:  Clear to auscultation bilaterally. No wheezes, rales, or rhonchi. No distress.  Heart:  S1, S2 present without murmurs appreciated.  Abdomen:  +BS, soft, non-tender and non-distended. No HSM noted. No guarding or rebound.  No masses appreciated.  Rectal:  Deferred  Msk:  Symmetrical without gross deformities. Normal posture. Extremities:  Without edema. Neurologic:  Alert and  oriented x4;  grossly normal neurologically. Skin:  Intact without significant lesions or rashes. Psych:  Normal mood and affect.    Assessment:  52 y.o. male with history of HTN, HLD, presenting today to discuss scheduling first-ever screening colonoscopy.  He reports having a hemorrhoid that ruptured about 6 weeks ago with rectal bleeding resolved with rectal cream prescribed by his PCP.  No ongoing hemorrhoid symptoms.  Also reports mild intermittent constipation that he manages with Metamucil as needed.  No other significant GI symptoms or alarm symptoms.  No family history of colon cancer.   Plan:  Proceed with colonoscopy with propofol by Dr. Levon Hedger in near future. The risks, benefits, and alternatives have been discussed with the patient in detail. The patient states understanding and desires to proceed.  ASA 2 OK to take metamucil daily.  If hemorrhoid symptoms, resume rectal cream as prescribed by PCP.  Follow-up per Dr. Wilburt Finlay recommendations.    Ermalinda Memos, PA-C [facility] 02/13/2023   I have reviewed the note and agree with the APP's assessment as described in this progress note  Katrinka Blazing, MD Gastroenterology and Hepatology [facility]astroenter[facility]

## 2023-02-11 NOTE — Progress Notes (Unsigned)
Referring Provider: Elfredia Nevins, MD Primary Care Physician:  Elfredia Nevins, MD Primary Gastroenterologist:  Dr. Levon Hedger  No chief complaint on file.   HPI:   Duane Crosby is a 52 y.o. male presenting today at the request of  Elfredia Nevins, MD for consult colonoscopy and hemorrhoids.   Past Medical History:  Diagnosis Date   Abnormal stress test    Coronary artery anomaly, congenital    a. 05/2018: Coronary CT showing  RCA originated form the left cusp and coursed between the ascending aorta and RVOT.   Essential hypertension    Hyperlipidemia    Obesity     Past Surgical History:  Procedure Laterality Date   LEFT HEART CATH AND CORONARY ANGIOGRAPHY N/A 03/19/2018   Procedure: LEFT HEART CATH AND CORONARY ANGIOGRAPHY;  Surgeon: Kathleene Hazel, MD;  Location: [facility];  Service: Cardiovascular;  Laterality: N/A;    Current Outpatient Medications  Medication Sig Dispense Refill   atorvastatin (LIPITOR) 10 MG tablet Take 10 mg by mouth daily.     lisinopril (ZESTRIL) 40 MG tablet TAKE 1 TABLET BY MOUTH EVERY DAY 60 tablet 0   No current facility-administered medications for this visit.    Allergies as of 02/13/2023   (No Known Allergies)    Family History  Problem Relation Age of Onset   Hypertension Father    Diabetes Mellitus II Father    Hypertension Brother     Social History   Socioeconomic History   Marital status: Married    Spouse name: [name]   Number of children: [name]   Years of education: [name]   Highest education level: [name]  Occupational History   [name]  Tobacco Use   Smoking status: Former   Smokeless tobacco: Never   Tobacco comments:    very rarely  Vaping Use   Vaping Use: Never used  Substance and Sexual Activity   Alcohol use: Yes    Alcohol/week: 0.0 standard drinks of alcohol    Comment: daily 6 pack per day.   Drug use: No   Sexual activity: [name]  Other  Topics Concern   [name]  Social History Narrative   [name]   Social Determinants of Health   Financial Resource Strain: [name]  Food Insecurity: [name]  Transportation Needs: [name]  Physical Activity: [name]  Stress: [name]  Social Connections: [name]  Intimate Partner Violence: [name]    Review of Systems: Gen: Denies any fever, chills, fatigue, weight loss, lack of appetite.  CV: Denies chest pain, heart palpitations, peripheral edema, syncope.  Resp: Denies shortness of breath at rest or with exertion. Denies wheezing or cough.  GI: Denies dysphagia or odynophagia. Denies jaundice, hematemesis, fecal incontinence. GU : Denies urinary burning, urinary frequency, urinary hesitancy MS: Denies joint pain, muscle weakness, cramps, or limitation of movement.  Derm: Denies rash, itching, dry skin Psych: Denies depression, anxiety, memory loss, and confusion Heme: Denies bruising, bleeding, and enlarged lymph nodes.  Physical Exam: There were no vitals taken for this visit. General:   Alert and oriented. Pleasant and cooperative. Well-nourished and well-developed.  Head:  Normocephalic and atraumatic. Eyes:  Without icterus, sclera clear and conjunctiva pink.  Ears:  Normal auditory acuity. Lungs:  Clear to auscultation bilaterally. No wheezes, rales, or rhonchi. No distress.  Heart:  S1, S2 present  without murmurs appreciated.  Abdomen:  +BS, soft, non-tender and non-distended. No HSM noted. No guarding or rebound. No masses appreciated.  Rectal:  Deferred  Msk:  Symmetrical without gross deformities. Normal posture. Extremities:  Without edema. Neurologic:  Alert and  oriented x4;  grossly normal neurologically. Skin:  Intact without significant lesions or rashes. Psych:  Alert and cooperative. Normal mood and affect.    Assessment:     Plan:  ***   Ermalinda Memos, PA-C [facility]astroenterology 02/13/2023

## 2023-02-13 ENCOUNTER — Ambulatory Visit (INDEPENDENT_AMBULATORY_CARE_PROVIDER_SITE_OTHER): Payer: BC Managed Care – PPO | Admitting: Gastroenterology

## 2023-02-13 ENCOUNTER — Encounter: Payer: Self-pay | Admitting: Gastroenterology

## 2023-02-13 VITALS — BP 135/76 | HR 73 | Temp 98.5°F | Ht 66.0 in | Wt 210.0 lb

## 2023-02-13 DIAGNOSIS — Z1211 Encounter for screening for malignant neoplasm of colon: Secondary | ICD-10-CM

## 2023-02-13 NOTE — Patient Instructions (Addendum)
Spanish instructions: Haremos los arreglos para realizarle una colonoscopia con el Dr. Jules Husbands en [facility].  Como comentamos, puedes tomar Metamucil 2 cucharaditas todos [place].  Si es necesario, puede aumentarlo a Surveyor, minerals.  Nos veremos cuando sea necesario.  Fue un placer conocerte hoy!  Ermalinda Memos, PA-C [facility] Gastroenterology    English Instructions:  We will arrange to have a colonoscopy with Dr. Levon Hedger at [facility].  As we discussed, you can take Metamucil 2 teaspoons every day.  If needed, you can increase this to twice daily.  We will see back as needed.  It was nice to meet you today!  Ermalinda Memos, PA-C [facility] Gastroenterology

## 2023-02-18 DIAGNOSIS — E6609 Other obesity due to excess calories: Secondary | ICD-10-CM | POA: Diagnosis not present

## 2023-02-18 DIAGNOSIS — Z6833 Body mass index (BMI) 33.0-33.9, adult: Secondary | ICD-10-CM | POA: Diagnosis not present

## 2023-02-18 DIAGNOSIS — M25562 Pain in left knee: Secondary | ICD-10-CM | POA: Diagnosis not present

## 2023-02-27 ENCOUNTER — Telehealth: Payer: Self-pay | Admitting: *Deleted

## 2023-02-27 NOTE — Telephone Encounter (Signed)
Spoke with pt. He has been scheduled for TCS with Dr. Levon Hedger ASA 2 8/7. He will come by [address] to pick up instructions and prep sample.

## 2023-03-04 DIAGNOSIS — Z0001 Encounter for general adult medical examination with abnormal findings: Secondary | ICD-10-CM | POA: Diagnosis not present

## 2023-03-04 DIAGNOSIS — Z1331 Encounter for screening for depression: Secondary | ICD-10-CM | POA: Diagnosis not present

## 2023-03-04 DIAGNOSIS — K760 Fatty (change of) liver, not elsewhere classified: Secondary | ICD-10-CM | POA: Diagnosis not present

## 2023-03-04 DIAGNOSIS — N183 Chronic kidney disease, stage 3 unspecified: Secondary | ICD-10-CM | POA: Diagnosis not present

## 2023-03-04 DIAGNOSIS — R7989 Other specified abnormal findings of blood chemistry: Secondary | ICD-10-CM | POA: Diagnosis not present

## 2023-03-04 DIAGNOSIS — E6609 Other obesity due to excess calories: Secondary | ICD-10-CM | POA: Diagnosis not present

## 2023-03-04 DIAGNOSIS — I10 Essential (primary) hypertension: Secondary | ICD-10-CM | POA: Diagnosis not present

## 2023-03-04 DIAGNOSIS — Z6833 Body mass index (BMI) 33.0-33.9, adult: Secondary | ICD-10-CM | POA: Diagnosis not present

## 2023-03-12 ENCOUNTER — Ambulatory Visit (HOSPITAL_COMMUNITY): Payer: BC Managed Care – PPO | Admitting: Anesthesiology

## 2023-03-12 ENCOUNTER — Ambulatory Visit (HOSPITAL_COMMUNITY)
Admission: RE | Admit: 2023-03-12 | Discharge: 2023-03-12 | Disposition: A | Discharge status: Home / Self Care | Payer: BC Managed Care – PPO | Attending: Gastroenterology | Admitting: Gastroenterology

## 2023-03-12 ENCOUNTER — Encounter (HOSPITAL_COMMUNITY)
Admission: RE | Disposition: A | Discharge status: Home / Self Care | Payer: Self-pay | Source: Home / Self Care | Attending: Gastroenterology

## 2023-03-12 ENCOUNTER — Encounter (INDEPENDENT_AMBULATORY_CARE_PROVIDER_SITE_OTHER): Payer: Self-pay | Admitting: *Deleted

## 2023-03-12 ENCOUNTER — Encounter (HOSPITAL_COMMUNITY): Payer: Self-pay | Admitting: Gastroenterology

## 2023-03-12 ENCOUNTER — Other Ambulatory Visit: Payer: Self-pay

## 2023-03-12 DIAGNOSIS — Z1211 Encounter for screening for malignant neoplasm of colon: Secondary | ICD-10-CM | POA: Diagnosis not present

## 2023-03-12 DIAGNOSIS — Z87891 Personal history of nicotine dependence: Secondary | ICD-10-CM | POA: Diagnosis not present

## 2023-03-12 DIAGNOSIS — K5730 Diverticulosis of large intestine without perforation or abscess without bleeding: Secondary | ICD-10-CM | POA: Diagnosis not present

## 2023-03-12 DIAGNOSIS — K648 Other hemorrhoids: Secondary | ICD-10-CM | POA: Diagnosis not present

## 2023-03-12 DIAGNOSIS — I10 Essential (primary) hypertension: Secondary | ICD-10-CM | POA: Diagnosis not present

## 2023-03-12 DIAGNOSIS — D120 Benign neoplasm of cecum: Secondary | ICD-10-CM | POA: Diagnosis not present

## 2023-03-12 DIAGNOSIS — I2510 Atherosclerotic heart disease of native coronary artery without angina pectoris: Secondary | ICD-10-CM | POA: Diagnosis not present

## 2023-03-12 DIAGNOSIS — K635 Polyp of colon: Secondary | ICD-10-CM | POA: Diagnosis not present

## 2023-03-12 HISTORY — PX: COLONOSCOPY WITH PROPOFOL: SHX5780

## 2023-03-12 HISTORY — PX: POLYPECTOMY: SHX5525

## 2023-03-12 LAB — HM COLONOSCOPY

## 2023-03-12 SURGERY — COLONOSCOPY WITH PROPOFOL
Anesthesia: General

## 2023-03-12 MED ORDER — PROPOFOL 500 MG/50ML IV EMUL
INTRAVENOUS | Status: DC | PRN
  Administered 2023-03-12: 150 ug/kg/min via INTRAVENOUS

## 2023-03-12 MED ORDER — PROPOFOL 10 MG/ML IV BOLUS
INTRAVENOUS | Status: DC | PRN
Start: 2023-03-12 — End: 2023-03-12
  Administered 2023-03-12 (×2): 20 mg via INTRAVENOUS
  Administered 2023-03-12: 60 mg via INTRAVENOUS

## 2023-03-12 MED ORDER — LACTATED RINGERS IV SOLN
INTRAVENOUS | Status: DC
  Administered 2023-03-12: 1000 mL via INTRAVENOUS

## 2023-03-12 MED ORDER — STERILE WATER FOR IRRIGATION IR SOLN
Status: DC | PRN
  Administered 2023-03-12: 120 mL

## 2023-03-12 NOTE — Op Note (Signed)
[facility] Patient Name: Duane Crosby Procedure Date: 03/12/2023 8:02 AM MRN: [id number] Date of Birth: 24-Dec-1970 Attending MD: Katrinka Blazing , , [id number] CSN: [id number] Age: 52 Admit Type: Outpatient Procedure:                Colonoscopy Indications:              Screening for colorectal malignant neoplasm Providers:                Katrinka Blazing, Sheran Fava, Zena Amos Referring MD:              Medicines:                Monitored Anesthesia Care Complications:            No immediate complications. Estimated Blood Loss:     Estimated blood loss: none. Procedure:                Pre-Anesthesia Assessment:                           - Prior to the procedure, a History and Physical                            was performed, and patient medications, allergies                            and sensitivities were reviewed. The patient's                            tolerance of previous anesthesia was reviewed.                           - The risks and benefits of the procedure and the                            sedation options and risks were discussed with the                            patient. All questions were answered and informed                            consent was obtained.                           - ASA Grade Assessment: II - A patient with mild                            systemic disease.                           After obtaining informed consent, the colonoscope                            was passed under direct vision. Throughout the  procedure, the patient's blood pressure, pulse, and                            oxygen saturations were monitored continuously. The                            [id number]-HQ190L ([id number]) scope was introduced through                            the anus and advanced to the the cecum, identified                            by appendiceal orifice and ileocecal valve. The                             colonoscopy was performed without difficulty. The                            patient tolerated the procedure well. The quality                            of the bowel preparation was adequate to identify                            polyps greater than 5 mm in size. Scope In: 8:17:39 AM Scope Out: 8:43:20 AM Scope Withdrawal Time: 0 hours 21 minutes 17 seconds  Total Procedure Duration: 0 hours 25 minutes 41 seconds  Findings:      The perianal and digital rectal examinations were normal.      A 5 mm polyp was found in the cecum. The polyp was sessile. The polyp       was removed with a cold snare. Resection and retrieval were complete.      Scattered medium-mouthed and small-mouthed diverticula were found in the       sigmoid colon, descending colon and ascending colon.      Non-bleeding internal hemorrhoids were found during retroflexion. The       hemorrhoids were small. Impression:               - One 5 mm polyp in the cecum, removed with a cold                            snare. Resected and retrieved.                           - Diverticulosis in the sigmoid colon, in the                            descending colon and in the ascending colon.                           - Non-bleeding internal hemorrhoids. Moderate Sedation:      Per Anesthesia Care Recommendation:           - Discharge patient to home (ambulatory).                           -  Resume previous diet.                           - Await pathology results.                           - Repeat colonoscopy in 5 years for surveillance.                            Will need a 2 day prep.                           - If persisting or worsening rectal bleeding, will                            proceed with hemorrhoidal banding Procedure Code(s):        --- Professional ---                           [phone], Colonoscopy, flexible; with removal of                            tumor(s), polyp(s), or other lesion(s) by  snare                            technique Diagnosis Code(s):        --- Professional ---                           Z12.11, Encounter for screening for malignant                            neoplasm of colon                           D12.0, Benign neoplasm of cecum                           K64.8, Other hemorrhoids                           K57.30, Diverticulosis of large intestine without                            perforation or abscess without bleeding CPT copyright 2022 American Medical Association. All rights reserved. The codes documented in this report are preliminary and upon coder review may  be revised to meet current compliance requirements. Katrinka Blazing, MD Katrinka Blazing,  03/12/2023 8:49:58 AM This report has been signed electronically. Number of Addenda: 0

## 2023-03-12 NOTE — Discharge Instructions (Signed)
You are being discharged to home.  Resume your previous diet.  We are waiting for your pathology results.  Your physician has recommended a repeat colonoscopy in five years for surveillance. Will need a 2 day prep.

## 2023-03-12 NOTE — Anesthesia Postprocedure Evaluation (Signed)
Anesthesia Post Note  Patient: Duane Crosby  Procedure(s) Performed: COLONOSCOPY WITH PROPOFOL POLYPECTOMY  Patient location during evaluation: Phase II Anesthesia Type: General Level of consciousness: awake and alert and oriented Pain management: pain level controlled Vital Signs Assessment: post-procedure vital signs reviewed and stable Respiratory status: spontaneous breathing, nonlabored ventilation and respiratory function stable Cardiovascular status: blood pressure returned to baseline and stable Postop Assessment: no apparent nausea or vomiting Anesthetic complications: no  No notable events documented.   Last Vitals:  Vitals:   03/12/23 0720 03/12/23 0846  BP: (!) 138/95 104/62  Pulse: (!) 54 (!) 54  Resp: 16 20  Temp: 36.5 C 36.5 C  SpO2: 96% 96%    Last Pain:  Vitals:   03/12/23 0846  TempSrc: Oral  PainSc: 0-No pain                 Mikyle Sox C Chidinma Clites

## 2023-03-12 NOTE — Transfer of Care (Signed)
Immediate Anesthesia Transfer of Care Note  Patient: Duane Crosby  Procedure(s) Performed: COLONOSCOPY WITH PROPOFOL POLYPECTOMY  Patient Location: PACU  Anesthesia Type:General  Level of Consciousness: awake, alert , and oriented  Airway & Oxygen Therapy: Patient Spontanous Breathing  Post-op Assessment: Report given to RN, Post -op Vital signs reviewed and stable, and Patient moving all extremities X 4  Post vital signs: Reviewed and stable  Last Vitals:  Vitals Value Taken Time  BP 104/62 03/12/23 0846  Temp 36.5 C 03/12/23 0846  Pulse 54 03/12/23 0846  Resp 20 03/12/23 0846  SpO2 96 % 03/12/23 0846    Last Pain:  Vitals:   03/12/23 0846  TempSrc: Oral  PainSc: 0-No pain      Patients Stated Pain Goal: 5 (03/12/23 0720)  Complications: No notable events documented.

## 2023-03-12 NOTE — Interval H&P Note (Signed)
History and Physical Interval Note:  03/12/2023 7:22 AM  Duane Crosby  has presented today for surgery, with the diagnosis of COLON CANCER SCREENING.  The various methods of treatment have been discussed with the patient and family. After consideration of risks, benefits and other options for treatment, the patient has consented to  Procedure(s) with comments: COLONOSCOPY WITH PROPOFOL (N/A) - 815am, asa 2 as a surgical intervention.  The patient's history has been reviewed, patient examined, no change in status, stable for surgery.  I have reviewed the patient's chart and labs.  Questions were answered to the patient's satisfaction.     Katrinka Blazing Mayorga

## 2023-03-12 NOTE — Anesthesia Preprocedure Evaluation (Signed)
Anesthesia Evaluation  Patient identified by MRN, date of birth, ID band Patient awake    Reviewed: Allergy & Precautions, H&P , NPO status , Patient's Chart, lab work & pertinent test results  Airway Mallampati: II  TM Distance: >3 FB Neck ROM: Full    Dental  (+) Dental Advisory Given, Partial Upper   Pulmonary former smoker   Pulmonary exam normal breath sounds clear to auscultation       Cardiovascular Exercise Tolerance: Good hypertension, Pt. on medications + CAD  Normal cardiovascular exam Rhythm:Regular Rate:Normal   The left ventricular systolic function is normal.  LV end diastolic pressure is normal.  The left ventricular ejection fraction is greater than 65% by visual estimate.  There is no mitral valve regurgitation.   1. No evidence of disease in the LAD or circumflex 2. I was unable to fully visualize the RCA despite many attempts with many different catheters from the right radial and right femoral artery approach. The RCA appears to be a large caliber vessel that arises from a high anterior location or possibly anomalous from the left coronary cusp. Given the contrast load used, I stopped the case.    Recommendations: Unable to fully visualize the RCA despite many attempts. No disease in the LAD or Circumflex. I would recommend a coronary CTA in several weeks to better assess his RCA.   IMPRESSION: 1. Coronary artery calcium score 7 Agatston units. This places the patient in the 76th percentile for age and gender, suggesting intermediate risk of future cardiac events.   2.  No obstructive CAD.   3. 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 The proximal RCA does appear compressed by its course between ascending aorta and RVOT.   Duane Crosby     Electronically Signed   By: Marca Ancona M.D.   On: 05/27/2018 07:44   Neuro/Psych negative  neurological ROS  negative psych ROS   GI/Hepatic negative GI ROS, Neg liver ROS,,,  Endo/Other  negative endocrine ROS    Renal/GU negative Renal ROS  negative genitourinary   Musculoskeletal negative musculoskeletal ROS (+)    Abdominal   Peds negative pediatric ROS (+)  Hematology negative hematology ROS (+)   Anesthesia Other Findings      Reproductive/Obstetrics negative OB ROS                             Anesthesia Physical Anesthesia Plan  ASA: 2  Anesthesia Plan: General   Post-op Pain Management: Minimal or no pain anticipated   Induction: Intravenous  PONV Risk Score and Plan: 1 and Propofol infusion  Airway Management Planned: Nasal Cannula and Natural Airway  Additional Equipment:   Intra-op Plan:   Post-operative Plan:   Informed Consent: I have reviewed the patients History and Physical, chart, labs and discussed the procedure including the risks, benefits and alternatives for the proposed anesthesia with the patient or authorized representative who has indicated his/her understanding and acceptance.     Dental advisory given  Plan Discussed with: CRNA and Surgeon  Anesthesia Plan Comments:         Anesthesia Quick Evaluation

## 2023-03-17 ENCOUNTER — Encounter (INDEPENDENT_AMBULATORY_CARE_PROVIDER_SITE_OTHER): Payer: Self-pay | Admitting: *Deleted

## 2023-03-19 ENCOUNTER — Encounter (HOSPITAL_COMMUNITY): Payer: Self-pay | Admitting: Gastroenterology

## 2023-06-09 DIAGNOSIS — M25561 Pain in right knee: Secondary | ICD-10-CM | POA: Diagnosis not present

## 2023-06-09 DIAGNOSIS — M25562 Pain in left knee: Secondary | ICD-10-CM | POA: Diagnosis not present

## 2023-06-09 DIAGNOSIS — M170 Bilateral primary osteoarthritis of knee: Secondary | ICD-10-CM | POA: Diagnosis not present

## 2023-06-09 DIAGNOSIS — M2391 Unspecified internal derangement of right knee: Secondary | ICD-10-CM | POA: Diagnosis not present

## 2023-08-28 ENCOUNTER — Other Ambulatory Visit (HOSPITAL_COMMUNITY): Payer: Self-pay | Admitting: Internal Medicine

## 2023-08-28 ENCOUNTER — Ambulatory Visit (HOSPITAL_COMMUNITY)
Admission: RE | Admit: 2023-08-28 | Discharge: 2023-08-28 | Disposition: A | Discharge status: Home / Self Care | Payer: BC Managed Care – PPO | Source: Ambulatory Visit | Attending: Internal Medicine | Admitting: Internal Medicine

## 2023-08-28 DIAGNOSIS — R042 Hemoptysis: Secondary | ICD-10-CM

## 2023-08-28 DIAGNOSIS — Z6834 Body mass index (BMI) 34.0-34.9, adult: Secondary | ICD-10-CM | POA: Diagnosis not present

## 2023-08-28 DIAGNOSIS — R0781 Pleurodynia: Secondary | ICD-10-CM

## 2023-08-28 DIAGNOSIS — E6609 Other obesity due to excess calories: Secondary | ICD-10-CM | POA: Diagnosis not present

## 2023-08-28 DIAGNOSIS — I10 Essential (primary) hypertension: Secondary | ICD-10-CM | POA: Diagnosis not present

## 2023-08-28 DIAGNOSIS — I700 Atherosclerosis of aorta: Secondary | ICD-10-CM | POA: Diagnosis not present

## 2023-08-28 DIAGNOSIS — R9389 Abnormal findings on diagnostic imaging of other specified body structures: Secondary | ICD-10-CM | POA: Diagnosis not present

## 2023-08-28 MED ORDER — IOHEXOL 350 MG/ML SOLN
75.0000 mL | Freq: Once | INTRAVENOUS | Status: AC | PRN
  Administered 2023-08-28: 75 mL via INTRAVENOUS

## 2023-09-10 DIAGNOSIS — Z6832 Body mass index (BMI) 32.0-32.9, adult: Secondary | ICD-10-CM | POA: Diagnosis not present

## 2023-09-10 DIAGNOSIS — M25562 Pain in left knee: Secondary | ICD-10-CM | POA: Diagnosis not present

## 2023-09-10 DIAGNOSIS — I10 Essential (primary) hypertension: Secondary | ICD-10-CM | POA: Diagnosis not present

## 2023-09-10 DIAGNOSIS — E6609 Other obesity due to excess calories: Secondary | ICD-10-CM | POA: Diagnosis not present

## 2023-09-10 DIAGNOSIS — M1712 Unilateral primary osteoarthritis, left knee: Secondary | ICD-10-CM | POA: Diagnosis not present

## 2023-09-25 DIAGNOSIS — M170 Bilateral primary osteoarthritis of knee: Secondary | ICD-10-CM | POA: Diagnosis not present

## 2023-09-25 DIAGNOSIS — M25562 Pain in left knee: Secondary | ICD-10-CM | POA: Diagnosis not present

## 2023-09-25 DIAGNOSIS — M25561 Pain in right knee: Secondary | ICD-10-CM | POA: Diagnosis not present

## 2024-03-11 DIAGNOSIS — M1712 Unilateral primary osteoarthritis, left knee: Secondary | ICD-10-CM | POA: Diagnosis not present

## 2024-03-11 DIAGNOSIS — Z6832 Body mass index (BMI) 32.0-32.9, adult: Secondary | ICD-10-CM | POA: Diagnosis not present

## 2024-03-11 DIAGNOSIS — I10 Essential (primary) hypertension: Secondary | ICD-10-CM | POA: Diagnosis not present

## 2024-03-11 DIAGNOSIS — Z9229 Personal history of other drug therapy: Secondary | ICD-10-CM | POA: Diagnosis not present

## 2024-03-11 DIAGNOSIS — K760 Fatty (change of) liver, not elsewhere classified: Secondary | ICD-10-CM | POA: Diagnosis not present

## 2024-03-11 DIAGNOSIS — E6609 Other obesity due to excess calories: Secondary | ICD-10-CM | POA: Diagnosis not present

## 2024-03-11 DIAGNOSIS — Z0001 Encounter for general adult medical examination with abnormal findings: Secondary | ICD-10-CM | POA: Diagnosis not present

## 2024-03-11 DIAGNOSIS — N183 Chronic kidney disease, stage 3 unspecified: Secondary | ICD-10-CM | POA: Diagnosis not present

## 2024-03-11 DIAGNOSIS — Z1331 Encounter for screening for depression: Secondary | ICD-10-CM | POA: Diagnosis not present

## 2024-04-27 DIAGNOSIS — Z23 Encounter for immunization: Secondary | ICD-10-CM | POA: Diagnosis not present

## 2024-04-27 DIAGNOSIS — M25561 Pain in right knee: Secondary | ICD-10-CM | POA: Diagnosis not present

## 2024-04-27 DIAGNOSIS — Z299 Encounter for prophylactic measures, unspecified: Secondary | ICD-10-CM | POA: Diagnosis not present

## 2024-04-27 DIAGNOSIS — R52 Pain, unspecified: Secondary | ICD-10-CM | POA: Diagnosis not present

## 2024-04-27 DIAGNOSIS — I10 Essential (primary) hypertension: Secondary | ICD-10-CM | POA: Diagnosis not present

## 2024-04-27 DIAGNOSIS — B372 Candidiasis of skin and nail: Secondary | ICD-10-CM | POA: Diagnosis not present

## 2024-04-27 DIAGNOSIS — L259 Unspecified contact dermatitis, unspecified cause: Secondary | ICD-10-CM | POA: Diagnosis not present

## 2024-04-27 DIAGNOSIS — E7800 Pure hypercholesterolemia, unspecified: Secondary | ICD-10-CM | POA: Diagnosis not present

## 2024-05-19 DIAGNOSIS — R509 Fever, unspecified: Secondary | ICD-10-CM | POA: Diagnosis not present

## 2024-05-19 DIAGNOSIS — Z299 Encounter for prophylactic measures, unspecified: Secondary | ICD-10-CM | POA: Diagnosis not present

## 2024-05-19 DIAGNOSIS — I10 Essential (primary) hypertension: Secondary | ICD-10-CM | POA: Diagnosis not present

## 2024-05-19 DIAGNOSIS — J069 Acute upper respiratory infection, unspecified: Secondary | ICD-10-CM | POA: Diagnosis not present

## 2024-05-24 DIAGNOSIS — R059 Cough, unspecified: Secondary | ICD-10-CM | POA: Diagnosis not present

## 2024-06-09 ENCOUNTER — Encounter (INDEPENDENT_AMBULATORY_CARE_PROVIDER_SITE_OTHER): Payer: Self-pay | Admitting: Gastroenterology
